# Patient Record
Sex: Female | Born: 1975 | Race: White | Hispanic: No | Smoking: Never smoker
Health system: Southern US, Community
[De-identification: ages and names within clinical notes are randomized; demographics above are authoritative.]

## PROBLEM LIST (undated history)

## (undated) DIAGNOSIS — G43909 Migraine, unspecified, not intractable, without status migrainosus: Secondary | ICD-10-CM

## (undated) DIAGNOSIS — F419 Anxiety disorder, unspecified: Secondary | ICD-10-CM

## (undated) DIAGNOSIS — F329 Major depressive disorder, single episode, unspecified: Secondary | ICD-10-CM

## (undated) DIAGNOSIS — I10 Essential (primary) hypertension: Secondary | ICD-10-CM

## (undated) DIAGNOSIS — O1495 Unspecified pre-eclampsia, complicating the puerperium: Secondary | ICD-10-CM

## (undated) DIAGNOSIS — F32A Depression, unspecified: Secondary | ICD-10-CM

## (undated) DIAGNOSIS — J45909 Unspecified asthma, uncomplicated: Secondary | ICD-10-CM

## (undated) HISTORY — DX: Unspecified pre-eclampsia, complicating the puerperium: O14.95

## (undated) HISTORY — DX: Migraine, unspecified, not intractable, without status migrainosus: G43.909

## (undated) HISTORY — DX: Essential (primary) hypertension: I10

## (undated) HISTORY — PX: KIDNEY STONE SURGERY: SHX686

---
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
## 1998-06-19 ENCOUNTER — Other Ambulatory Visit: Admission: RE | Admit: 1998-06-19 | Discharge: 1998-06-19 | Payer: Self-pay | Admitting: Obstetrics & Gynecology

## 1999-04-18 ENCOUNTER — Emergency Department (HOSPITAL_COMMUNITY): Admission: EM | Admit: 1999-04-18 | Discharge: 1999-04-18 | Payer: Self-pay | Admitting: Emergency Medicine

## 1999-04-19 ENCOUNTER — Encounter: Payer: Self-pay | Admitting: Family Medicine

## 1999-04-19 ENCOUNTER — Ambulatory Visit (HOSPITAL_COMMUNITY): Admission: RE | Admit: 1999-04-19 | Discharge: 1999-04-19 | Payer: Self-pay | Admitting: Family Medicine

## 2000-07-09 ENCOUNTER — Encounter: Payer: Self-pay | Admitting: Obstetrics and Gynecology

## 2000-07-09 ENCOUNTER — Ambulatory Visit (HOSPITAL_COMMUNITY): Admission: RE | Admit: 2000-07-09 | Discharge: 2000-07-09 | Payer: Self-pay | Admitting: Obstetrics and Gynecology

## 2000-09-10 ENCOUNTER — Ambulatory Visit (HOSPITAL_COMMUNITY): Admission: RE | Admit: 2000-09-10 | Discharge: 2000-09-10 | Payer: Self-pay | Admitting: Obstetrics and Gynecology

## 2000-09-24 ENCOUNTER — Encounter: Admission: RE | Admit: 2000-09-24 | Discharge: 2000-12-23 | Payer: Self-pay | Admitting: Obstetrics and Gynecology

## 2000-10-22 ENCOUNTER — Inpatient Hospital Stay (HOSPITAL_COMMUNITY): Admission: AD | Admit: 2000-10-22 | Discharge: 2000-10-22 | Payer: Self-pay | Admitting: Obstetrics and Gynecology

## 2000-10-23 ENCOUNTER — Inpatient Hospital Stay (HOSPITAL_COMMUNITY): Admission: AD | Admit: 2000-10-23 | Discharge: 2000-10-23 | Payer: Self-pay | Admitting: Obstetrics and Gynecology

## 2000-10-27 ENCOUNTER — Encounter (HOSPITAL_COMMUNITY): Admission: RE | Admit: 2000-10-27 | Discharge: 2000-12-02 | Payer: Self-pay | Admitting: Obstetrics and Gynecology

## 2000-12-01 ENCOUNTER — Inpatient Hospital Stay (HOSPITAL_COMMUNITY): Admission: AD | Admit: 2000-12-01 | Discharge: 2000-12-04 | Payer: Self-pay | Admitting: Obstetrics and Gynecology

## 2000-12-08 ENCOUNTER — Inpatient Hospital Stay (HOSPITAL_COMMUNITY): Admission: AD | Admit: 2000-12-08 | Discharge: 2000-12-09 | Payer: Self-pay | Admitting: Obstetrics and Gynecology

## 2001-05-05 ENCOUNTER — Other Ambulatory Visit: Admission: RE | Admit: 2001-05-05 | Discharge: 2001-05-05 | Payer: Self-pay | Admitting: Obstetrics and Gynecology

## 2001-08-09 ENCOUNTER — Encounter: Payer: Self-pay | Admitting: Family Medicine

## 2001-08-09 ENCOUNTER — Encounter: Admission: RE | Admit: 2001-08-09 | Discharge: 2001-08-09 | Payer: Self-pay | Admitting: Family Medicine

## 2001-12-23 ENCOUNTER — Encounter: Admission: RE | Admit: 2001-12-23 | Discharge: 2001-12-23 | Payer: Self-pay | Admitting: Family Medicine

## 2001-12-23 ENCOUNTER — Encounter: Payer: Self-pay | Admitting: Family Medicine

## 2002-02-07 ENCOUNTER — Encounter: Admission: RE | Admit: 2002-02-07 | Discharge: 2002-02-07 | Payer: Self-pay | Admitting: Family Medicine

## 2002-02-07 ENCOUNTER — Encounter: Payer: Self-pay | Admitting: Family Medicine

## 2002-04-18 ENCOUNTER — Encounter: Admission: RE | Admit: 2002-04-18 | Discharge: 2002-04-18 | Payer: Self-pay | Admitting: Family Medicine

## 2002-04-18 ENCOUNTER — Encounter: Payer: Self-pay | Admitting: Family Medicine

## 2002-04-26 ENCOUNTER — Encounter: Admission: RE | Admit: 2002-04-26 | Discharge: 2002-04-26 | Payer: Self-pay | Admitting: Family Medicine

## 2002-04-26 ENCOUNTER — Encounter: Payer: Self-pay | Admitting: Family Medicine

## 2002-06-16 ENCOUNTER — Encounter: Payer: Self-pay | Admitting: Family Medicine

## 2002-06-16 ENCOUNTER — Encounter: Admission: RE | Admit: 2002-06-16 | Discharge: 2002-06-16 | Payer: Self-pay | Admitting: Family Medicine

## 2002-07-05 ENCOUNTER — Other Ambulatory Visit: Admission: RE | Admit: 2002-07-05 | Discharge: 2002-07-05 | Payer: Self-pay | Admitting: Obstetrics and Gynecology

## 2003-06-28 ENCOUNTER — Other Ambulatory Visit: Admission: RE | Admit: 2003-06-28 | Discharge: 2003-06-28 | Payer: Self-pay | Admitting: Obstetrics and Gynecology

## 2003-08-01 ENCOUNTER — Encounter: Payer: Self-pay | Admitting: Obstetrics and Gynecology

## 2003-08-01 ENCOUNTER — Ambulatory Visit (HOSPITAL_COMMUNITY): Admission: RE | Admit: 2003-08-01 | Discharge: 2003-08-01 | Payer: Self-pay | Admitting: Obstetrics and Gynecology

## 2003-11-21 ENCOUNTER — Inpatient Hospital Stay (HOSPITAL_COMMUNITY): Admission: AD | Admit: 2003-11-21 | Discharge: 2003-11-21 | Payer: Self-pay | Admitting: Internal Medicine

## 2003-12-07 ENCOUNTER — Inpatient Hospital Stay (HOSPITAL_COMMUNITY): Admission: AD | Admit: 2003-12-07 | Discharge: 2003-12-07 | Payer: Self-pay | Admitting: Obstetrics and Gynecology

## 2003-12-15 ENCOUNTER — Inpatient Hospital Stay (HOSPITAL_COMMUNITY): Admission: AD | Admit: 2003-12-15 | Discharge: 2003-12-15 | Payer: Self-pay | Admitting: Obstetrics and Gynecology

## 2003-12-26 ENCOUNTER — Inpatient Hospital Stay (HOSPITAL_COMMUNITY): Admission: AD | Admit: 2003-12-26 | Discharge: 2003-12-28 | Payer: Self-pay | Admitting: Obstetrics and Gynecology

## 2004-01-04 ENCOUNTER — Inpatient Hospital Stay (HOSPITAL_COMMUNITY): Admission: AD | Admit: 2004-01-04 | Discharge: 2004-01-08 | Payer: Self-pay | Admitting: Obstetrics and Gynecology

## 2004-11-25 ENCOUNTER — Other Ambulatory Visit: Admission: RE | Admit: 2004-11-25 | Discharge: 2004-11-25 | Payer: Self-pay | Admitting: Obstetrics and Gynecology

## 2005-12-29 ENCOUNTER — Other Ambulatory Visit: Admission: RE | Admit: 2005-12-29 | Discharge: 2005-12-29 | Payer: Self-pay | Admitting: Obstetrics and Gynecology

## 2007-11-08 ENCOUNTER — Encounter: Admission: RE | Admit: 2007-11-08 | Discharge: 2007-11-08 | Payer: Self-pay | Admitting: Family Medicine

## 2010-11-13 ENCOUNTER — Inpatient Hospital Stay (HOSPITAL_COMMUNITY)
Admission: AD | Admit: 2010-11-13 | Discharge: 2010-11-18 | Payer: Self-pay | Source: Home / Self Care | Attending: Obstetrics and Gynecology | Admitting: Obstetrics and Gynecology

## 2010-11-15 ENCOUNTER — Ambulatory Visit (HOSPITAL_COMMUNITY)
Admission: RE | Admit: 2010-11-15 | Discharge: 2010-11-15 | Payer: Self-pay | Source: Home / Self Care | Attending: Obstetrics and Gynecology | Admitting: Obstetrics and Gynecology

## 2010-11-16 ENCOUNTER — Ambulatory Visit (HOSPITAL_COMMUNITY)
Admission: RE | Admit: 2010-11-16 | Discharge: 2010-11-16 | Payer: Self-pay | Source: Home / Self Care | Attending: Obstetrics and Gynecology | Admitting: Obstetrics and Gynecology

## 2010-11-17 ENCOUNTER — Ambulatory Visit
Admission: AD | Admit: 2010-11-17 | Discharge: 2010-11-17 | Payer: Self-pay | Source: Home / Self Care | Attending: Urology | Admitting: Urology

## 2010-12-31 ENCOUNTER — Inpatient Hospital Stay (HOSPITAL_COMMUNITY)
Admission: AD | Admit: 2010-12-31 | Discharge: 2011-01-02 | DRG: 372 | Disposition: A | Discharge status: Home / Self Care | Payer: BC Managed Care – PPO | Source: Ambulatory Visit | Attending: Obstetrics and Gynecology | Admitting: Obstetrics and Gynecology

## 2010-12-31 DIAGNOSIS — O99892 Other specified diseases and conditions complicating childbirth: Secondary | ICD-10-CM | POA: Diagnosis present

## 2010-12-31 DIAGNOSIS — N200 Calculus of kidney: Secondary | ICD-10-CM | POA: Diagnosis present

## 2010-12-31 LAB — CBC
HCT: 27.8 % — ABNORMAL LOW (ref 36.0–46.0)
Hemoglobin: 9.1 g/dL — ABNORMAL LOW (ref 12.0–15.0)
Hemoglobin: 9.1 g/dL — ABNORMAL LOW (ref 12.0–15.0)
MCH: 27 pg (ref 26.0–34.0)
MCHC: 32.9 g/dL (ref 30.0–36.0)
MCHC: 32.7 g/dL (ref 30.0–36.0)
MCV: 82.5 fL (ref 78.0–100.0)
Platelets: 187 10*3/uL (ref 150–400)
RBC: 3.36 MIL/uL — ABNORMAL LOW (ref 3.87–5.11)
RDW: 13.8 % (ref 11.5–15.5)

## 2010-12-31 LAB — COMPREHENSIVE METABOLIC PANEL
ALT: 17 U/L (ref 0–35)
AST: 19 U/L (ref 0–37)
Albumin: 2.5 g/dL — ABNORMAL LOW (ref 3.5–5.2)
CO2: 23 mEq/L (ref 19–32)
Calcium: 8.2 mg/dL — ABNORMAL LOW (ref 8.4–10.5)
GFR calc Af Amer: >60 mL/min (ref >60)
GFR calc non Af Amer: >60 mL/min (ref >60)
Sodium: 135 mEq/L (ref 135–145)

## 2010-12-31 LAB — RPR: RPR Ser Ql: NONREACTIVE

## 2011-01-01 LAB — CBC
HCT: 26.5 % — ABNORMAL LOW (ref 36.0–46.0)
Hemoglobin: 8.7 g/dL — ABNORMAL LOW (ref 12.0–15.0)
MCH: 27.3 pg (ref 26.0–34.0)
MCHC: 32.8 g/dL (ref 30.0–36.0)
MCV: 83.1 fL (ref 78.0–100.0)
RDW: 13.8 % (ref 11.5–15.5)

## 2011-01-01 LAB — BASIC METABOLIC PANEL
BUN: 4 mg/dL — ABNORMAL LOW (ref 6–23)
CO2: 24 mEq/L (ref 19–32)
Calcium: 8.2 mg/dL — ABNORMAL LOW (ref 8.4–10.5)
GFR calc non Af Amer: >60 mL/min (ref >60)
Glucose, Bld: 87 mg/dL (ref 70–99)

## 2011-01-05 ENCOUNTER — Inpatient Hospital Stay (HOSPITAL_COMMUNITY)
Admission: AD | Admit: 2011-01-05 | Discharge: 2011-01-05 | Disposition: A | Discharge status: Home / Self Care | Payer: BC Managed Care – PPO | Source: Ambulatory Visit | Attending: Obstetrics and Gynecology | Admitting: Obstetrics and Gynecology

## 2011-01-05 DIAGNOSIS — O135 Gestational [pregnancy-induced] hypertension without significant proteinuria, complicating the puerperium: Secondary | ICD-10-CM | POA: Insufficient documentation

## 2011-01-05 LAB — CBC
HCT: 26.9 % — ABNORMAL LOW (ref 36.0–46.0)
MCH: 27.1 pg (ref 26.0–34.0)
MCV: 81.8 fL (ref 78.0–100.0)
Platelets: 228 10*3/uL (ref 150–400)
RDW: 14.2 % (ref 11.5–15.5)
WBC: 8.9 10*3/uL (ref 4.0–10.5)

## 2011-01-05 LAB — COMPREHENSIVE METABOLIC PANEL
Albumin: 2.5 g/dL — ABNORMAL LOW (ref 3.5–5.2)
Alkaline Phosphatase: 103 U/L (ref 39–117)
BUN: 6 mg/dL (ref 6–23)
Chloride: 104 mEq/L (ref 96–112)
Creatinine, Ser: 0.44 mg/dL (ref 0.4–1.2)
Glucose, Bld: 94 mg/dL (ref 70–99)
Potassium: 3.4 mEq/L — ABNORMAL LOW (ref 3.5–5.1)
Total Bilirubin: 0.4 mg/dL (ref 0.3–1.2)
Total Protein: 5.9 g/dL — ABNORMAL LOW (ref 6.0–8.3)

## 2011-01-05 LAB — URINALYSIS, ROUTINE W REFLEX MICROSCOPIC
Bilirubin Urine: NEGATIVE
Ketones, ur: NEGATIVE mg/dL
Nitrite: NEGATIVE
Urobilinogen, UA: 0.2 mg/dL (ref 0.0–1.0)

## 2011-01-05 LAB — URIC ACID: Uric Acid, Serum: 4.3 mg/dL (ref 2.4–7.0)

## 2011-01-05 LAB — LACTATE DEHYDROGENASE: LDH: 188 U/L (ref 94–250)

## 2011-01-12 NOTE — Discharge Summary (Signed)
  Annette Cowan, Annette Cowan         ACCOUNT NO.:  [id number]  MEDICAL RECORD NO.:  [id number]           PATIENT TYPE:  I  LOCATION:  9112                          FACILITY:  [facility]  PHYSICIAN:  Zenaida Niece, M.D.DATE OF BIRTH:  1976-06-30  DATE OF ADMISSION:  12/31/2010 DATE OF DISCHARGE:  01/02/2011                              DISCHARGE SUMMARY   ADMISSION DIAGNOSES:  Intrauterine pregnancy at 37 weeks and hypokalemia.  DISCHARGE DIAGNOSES:  Intrauterine pregnancy at 37 weeks and hypokalemia.  PROCEDURE:  On January 01, 2011, she had a spontaneous vaginal delivery.  HISTORY AND PHYSICAL:  This is a 35 year old gravida 3, para 2-0-0-2 with an EGA of 37 plus weeks, who presents with a complaint of regular contractions.  On evaluation in Triage, cervix changed from 3-4 cm dilated.  Prenatal care complicated by kidney stones for which she had cystoscopy and a stent placed but than the stent came out spontaneously.PAST OBSTETRICAL HISTORY:  Two vaginal deliveries at term 7 pounds 6 ounces, 8 pounds 10 ounces, and postpartum PIH x2.  PAST MEDICAL HISTORY:  Kidney stones, pyelonephritis, and depression.  PAST SURGICAL HISTORY:  Urethral dilation.  CURRENT MEDICATIONS:  Zoloft 100 mg p.o. daily.  PHYSICAL EXAMINATION:  She is afebrile with stable vital signs, although blood pressure is slightly labile.  Fetal heart tracing category I with contractions every 4-6 minutes. ABDOMEN:  Gravid, nontender with an estimated fetal weight of 8 pounds. Cervix on my exam is 4, 50, -2 vertex presentation and membranes were ruptured revealing clear fluid.  Admission labs revealed normal PIH labs except for potassium of 2.6.  HOSPITAL COURSE:  The patient was admitted and membranes ruptured to augment labor.  She was given IV potassium runs.  She progressed and received an epidural.  She required Pitocin augmentation.  She progressed to complete, pushed well, early on the morning of the  16th had a vaginal delivery of a viable female infant with Apgars of 7 and 9, weight 8 pounds 7 ounces.  Placenta was manually removed about 25 minutes after delivery.  Perineum was intact and estimated blood loss was 500 mL.  Overnight she did well.  Pre-delivery hemoglobin 9.1, postdelivery 8.7.  Repeat potassium on the morning of the 16th was 2.8. She was put on p.o. potassium at that point.  She had no further complications.  Repeat potassium on February 17, was 3.0.  She was having no problems and requested discharge home.  She was felt to be stable enough for discharge home.  DISCHARGE INSTRUCTIONS:  Regular diet, pelvic rest.  FOLLOWUP:  Follow up is in 1 week to check her potassium level.  MEDICATIONS: 1. Over-the-counter ibuprofen as needed. 2. Percocet #20 one to two p.o. q.4-6 h. p.r.n. pain. 3. K-Dur 20 mEq #30 one p.o. t.i.d. until we can replace her     potassium.     Zenaida Niece, M.D.     TDM/MEDQ  D:  01/02/2011  T:  01/03/2011  Job:  [id number]  Electronically Signed by Lavina Hamman M.D. on 01/12/2011 01:32:44 PM

## 2011-01-26 LAB — URINE MICROSCOPIC-ADD ON

## 2011-01-26 LAB — URINALYSIS, ROUTINE W REFLEX MICROSCOPIC
Ketones, ur: >80 mg/dL — AB
Nitrite: NEGATIVE
Protein, ur: 30 mg/dL — AB
Urobilinogen, UA: 0.2 mg/dL (ref 0.0–1.0)

## 2011-01-26 LAB — CBC
MCH: 27.7 pg (ref 26.0–34.0)
MCHC: 33 g/dL (ref 30.0–36.0)
Platelets: 183 10*3/uL (ref 150–400)
RDW: 14.1 % (ref 11.5–15.5)

## 2011-04-03 NOTE — Discharge Summary (Signed)
NAMEMarland Cowan  Annette, Cowan                   ACCOUNT NO.:  [id number]   MEDICAL RECORD NO.:  [id number]                   PATIENT TYPE:  INP   LOCATION:  9374                                 FACILITY:  [facility]   PHYSICIAN:  Malachi Pro. Ambrose Mantle, M.D.              DATE OF BIRTH:  11-26-1975   DATE OF ADMISSION:  01/04/2004  DATE OF DISCHARGE:  01/08/2004                                 DISCHARGE SUMMARY   BRIEF HISTORY:  35 year-old white female para 2-0-0-2 admitted with  persistent headache, hypertension, and proteinuria.  Patient delivered on  December 26, 2003 an 8-pound 10-ounce infant with moderate shoulder dystocia,  blood pressures were perfectly normal, she was discharged December 28, 2003.  On December 30, 2003 she began having a headache which was constant and  throbbing, she used Percocet without relief, the headache persisted and she  came to our office on the day of admission with headache, blood pressure was  140/92, reflexes were 3 to 4+, she was sent to the hospital for evaluation  with urine protein of 30 mg%, PIH blood work was normal, five blood  pressures were 193/96, 177/88, 157/88, 173/90 and 170/89, the  anesthesiologist did not feel that the patient had a spinal headache.   PAST MEDICAL HISTORY:  1. No known allergies.  2. Operations:  Bladder stim/stretch x2.  3. Illnesses:  Depression, postpartum hypertension and headache after first     delivery; she was hospitalized.   FAMILY HISTORY:  Mother with high blood pressure, depression, thyroid  dysfunction and migraines.  Brother with migraines and depression.   MEDICATIONS:  1. Zoloft 50 mg by mouth every day.  2. Percocet.   OBSTETRICAL HISTORY:  In January 2002 a 7-pound 6-ounce female and in  February 2005 an 8-pound 10-ounce infant.  Both delivered vaginally.   PHYSICAL EXAMINATION:  On admission the patient's vital signs showed the  blood pressure 170/89; pulse, respirations and temperature were  normal.  Her  neck was supple without thyromegaly.  Extraocular movements were intact.  Fundi looked benign.  Heart normal size and sounds; no murmurs.  Lungs were  to P&A.  Abdomen soft, nontender, the fundus was two-thirds of the way to  the umbilicus.  Pelvic exam was deferred.  Deep tendon reflexes were 3 to  4+.  There was equal strength in the upper and lower extremities.   HOSPITAL COURSE:  Patient was placed on magnesium sulfate.  On January 05, 2004 her headache was improved.  She did have a diuresis.  Her potassium was  2.8.  She was placed on potassium.  The headache persisted still located in  her occiput with transmission into her upper neck.  The headache was worse  when she would get up from a lying down position.  She began having  diarrhea.  I asked anesthesia to see her related to a possible spinal  headache.  He did not think that was likely  after examining her and talking  to her.  CT scan of the head was normal.  She did show 1026 mg of protein in  the 24-hour urine specimen confirming preeclampsia.  Patient was placed on  Flexeril for her neck pain but this caused a desaturation into the 80s on  her O2 saturation.  Her diarrhea improved with Lomotil.  Her blood pressures  remained high.  She was given Procardia and she will be discharged on  Procardia.  Stool was sent for culture and sensitivity, Clostridium  difficile, and ova and parasites.  The C. difficile was negative.  Ova and  parasites and culture are pending.  Sets of PIH panels were completely  normal.  Hemoglobin varied from 9.9 to 11, platelet count 498 to 443.  All  liver function tests were normal.  The potassium corrected from 2.8 to 3.7.  Her magnesium level was checked twice and was 5.5 and 6.4.  Urinalysis  showed 30 mg% of protein, 24-hour urine protein on 5400 mL of urine showed  1026 mg, creatinine clearance was 136 mL/min.   FINAL DIAGNOSES:  1. Postpartum preeclampsia.  2. Neck pain of  uncertain etiology.  3. Headache possibly related to the preeclampsia.  4. Diarrhea controlled at the present.  5. Persistent hypertension.   DISCHARGE INSTRUCTIONS:  Patient is discharged on Procardia XL 30 mg a day  and she will complete another 24-hour urine prior to discharge.  She is  asked to return to the office in 1 week for followup examination and to call  with any exacerbation of symptoms.                                               Malachi Pro. Ambrose Mantle, M.D.    TFH/MEDQ  D:  01/08/2004  T:  01/08/2004  Job:  [id number]

## 2011-04-03 NOTE — Discharge Summary (Signed)
[facility]  Patient:    Annette Cowan, Annette Cowan                MRN: [id number] Adm. Date:  [id number] Disc. Date: [id number] Attending:  Oliver Pila                           Discharge Summary                                A 35 year old white female five days postpartum admitted with high blood pressure and headache.  The patient was well until December 06, 2000 when she developed a frontal headache.  Home visit by the nurse showed a blood pressure of 178/110.  Prenatal blood pressures were all normal as were postpartum blood pressures.  She denied double vision.  She had had slight blurred vision.  She had no weakness of her extremities.  She had no slurred speech and no lack of coordination.  Blood pressures in the hospital after delivery had been 106/58 and 120/80.  Her blood pressures on admission were 168/87, 176/97, 186/97, 182/95.  Her physical examination was unremarkable for a postpartum patient.  I placed her on IV phenobarbital and bed rest.  I also had anesthesia to see her to see if the headache could be related to a lumbar puncture at the time of epidural.  On Procardia 30 mg extended release and phenobarbital the patients headache improved.  Her blood pressures came down.  Her last four blood pressures have been 135/73, 130/80, 156/80, 142/80.  She is ready for discharge.  At discharge her white count was 8900, hemoglobin 12.8, hematocrit 37.2, platelet count 343,000.  Comprehensive metabolic profile showed normal liver function tests, AST 27, ALT 24, total bilirubin 0.4, LDH 236.  Urinalysis did show 30 mg % of protein.  Uric acid was 4.7.  FINAL DIAGNOSES:              Postpartum hypertension with headache.  CONDITION ON DISCHARGE:       Improved.  INSTRUCTIONS:                 Regular diet.  Rest.  Phenobarbital 60 mg p.o. q.6h. for three days.  Procardia XL 30 mg p.o. q.d.  Return to the office in one week. DD:   12/09/00 TD:  12/09/00 Job: [id number]

## 2011-04-03 NOTE — Discharge Summary (Signed)
[facility]  Patient:    Annette Cowan, Annette Cowan                MRN: [id number] Adm. Date:  [id number] Disc. Date: [id number] Attending:  Malon Kindle                           Discharge Summary  ADMISSION DIAGNOSES:          Intrauterine pregnancy at 39 weeks, class A1 gestational diabetes, history of depression.  DISCHARGE DIAGNOSES:          Intrauterine pregnancy at 39 weeks, class A1 gestational diabetes, history of depression.  PROCEDURE:                    Spontaneous vaginal delivery on December 02, 2000.  COMPLICATIONS:                Postpartum depression.  CONSULTATIONS:                None.  HISTORY AND PHYSICAL:         This is a 35 year old white female gravida 1, para 0 with an EGA of [redacted] weeks with an EDC of January 22 by a first trimester ultrasound who presents for induction due to low back pain, depression, and a favorable cervix.  Pregnancy complicated by A1 diabetes well controlled on diet.  She has a history of depression and discontinued her antidepressant until she had an increasing problem at 36 weeks and at that time she restarted her Wellbutrin at our recommendation and she has been slowly improving.  PRENATAL LABORATORIES:        Blood type O+.  Negative antibody screen.  RPR nonreactive.  Rubella immune.  Hepatitis B surface antigen negative.  HIV negative.  Gonorrhea and chlamydia negative.  Group B strep negative.  PAST SURGICAL HISTORY:        Bladder stem stretched x 2.  PAST MEDICAL HISTORY:         History of UTIs, pyelonephritis, and depression.  SOCIAL HISTORY:               She is married and nonsmoker.  PHYSICAL EXAMINATION:  VITAL SIGNS:                  Afebrile with stable vital signs.  Fetal heart tracing was reactive and on the monitor she had irregular contractions.  ABDOMEN:                      Soft, nontender with an estimated fetal weight of 7-7.5 pounds.  Admission glucose  82.  PELVIC:                       Cervix was 1+, 70, -2 with a vertex presentation.  HOSPITAL COURSE:              The patient was admitted and Dr. Senaida Ores was able to perform artificial rupture of membranes which revealed clear amniotic fluid.  She was then started on low dose Pitocin.  She progressed into active labor and received an epidural.  She had a slightly protractive course, but did eventually reach complete and pushed well.  Early on the morning of January 17 she had an SVD of a viable female infant with Apgars of 8 and 9 that weighed 7 pounds 6 ounces.  Placenta delivered spontaneous and was intact.  She had a three vessel cord.  Cervix and rectum were intact.  She had a second degree laceration repaired with 2-0 Vicryl.  Estimated blood loss was approximately 350 cc.  Postpartum she did very well except for depression and she was restarted on her Wellbutrin 75 mg b.i.d.  Predelivery hemoglobin was 12, postdelivery was 10.8.  On the morning of postpartum day #2 she was feeling better and was felt to be stable enough for discharge home.  She was bottle feeding her infant.  CONDITION ON DISCHARGE:       Stable.  DISPOSITION:                  Discharged to home.  DIET:                         Regular diet.  ACTIVITY:                     Pelvic rest.  FOLLOW-UP:                    Six weeks.  DISCHARGE MEDICATIONS:        1. Over-the-counter ibuprofen or Aleve p.r.n.                                  pain.                               2. Tylenol p.r.n. pain.                               3. Colace b.i.d. to aid in having a bowel                                  movement.                               4. Wellbutrin 75 mg b.i.d. DD:  12/04/00 TD:  12/04/00 Job: [id number]

## 2011-04-03 NOTE — Discharge Summary (Signed)
NAMEMarland Cowan  LONNETTA, KNISKERN                   ACCOUNT NO.:  [id number]   MEDICAL RECORD NO.:  [id number]                   PATIENT TYPE:  INP   LOCATION:  9103                                 FACILITY:  [facility]   PHYSICIAN:  Huel Cote, M.D.              DATE OF BIRTH:  05-14-1976   DATE OF ADMISSION:  12/26/2003  DATE OF DISCHARGE:  12/28/2003                                 DISCHARGE SUMMARY   DISCHARGE DIAGNOSES:  1. Term pregnancy at 38 plus weeks, delivered.  2. A1 diabetes with good control.  3. History of depression.  4. Status post normal spontaneous vaginal delivery with vacuum assistance.   DISCHARGE MEDICATIONS:  1. Motrin 600 mg p.o. every six hours.  2. Percocet one to two tablets p.o. every four hours p.r.n.   FOLLOW UP:  The patient is to follow-up in the office in approximately six  weeks for her routine postpartum exam.   HOSPITAL COURSE:  The patient is a 35 year old Gravida II, Para 1-0-01 who  was admitted at 41 plus weeks gestation for induction of labor given term  status and very variable cervix.  The pregnancy was complicated by A1  diabetes which was excellently controlled and also a history of depression  which was stable her entire pregnancy on Zoloft.  She also had an episode of  hypertension following her last delivery which required readmission,  however, was stable with her blood pressure throughout this prenatal care  and on admission.   OB HISTORY:  Past OB history is significant for a vaginal delivery in  January 2002 of a 7 pound 6 ounce infant.   GYN HISTORY:  None.   PAST MEDICAL HISTORY:  1. Depression.   PAST SURGICAL HISTORY:  1. Bladder stem stretching.   PRENATAL LABS:  O positive.  Antibody negative.  RPR non-reactive.  Rubella  immune.  Hepatitis B surface antigen negative. HIV negative.  GC negative.  Chlamydia negative.  Triple screen negative.  One hour Glucola was 181.  Group B Strep negative.   MEDICATIONS PRIOR TO  ADMISSION:  1. Zoloft 50 mg daily.   ALLERGIES:  None.   HOSPITAL COURSE:  Fetal  heart rate was reactive.  Upon admission blood  sugar was 119.  Her blood pressure was normal as were her other vital signs.  Her cervix was 50, 3 and a -2.  She had rupture of membranes performed with  clear fluid obtained and reached complete dilation throughout the day.  She  pushed for approximately 1 and 1/2 hours with descent of fetal vertex to +3  station.  The patient had significant sacral pain with pushing which became  unbearable and she requested assisted delivery given the outlet station of  fetal vertex. An M-Cup vacuum was applied to the +3 station and the vertex  delivered to crowning with two pulls.  The remainder of the head was pushed  out by maternal effort and a moderate shoulder  dystocia was encountered.  McRoberts, wood screw and suprapubic were attempted, however, did not  release the shoulder.  Therefore, a posterior arm delivery was performed  with anterior shoulder following without difficulty.  The baby was slightly  floppy after delivery given the shoulder dystocia, but responded quickly to  stimulation and blow-by 02.  Apgars were 6 and 8 and weight was 8 pounds 10  ounces.  The placenta was partially manually extracted after it was noted to  be slightly adherent.  The fundus was inspected carefully with no remaining  fragments palpated.  A first degree laceration was repaired with 2-0 Vicryl.  The cervix and rectum were intact.  Estimated blood loss was 400 mL.  The  patient's postpartum hemoglobin was 8.6 on discharge and she was doing very  well with minimal lochia upon discharge.  However, on postpartum day number  one she was noted to pass several large clots.  This stabilized after  bimanual massage and required no further intervention.  Her blood pressure  remained stable throughout her postpartum course and on postpartum day  number two she was felt stable for discharge  home.                                               Huel Cote, M.D.    KR/MEDQ  D:  01/29/2004  T:  01/31/2004  Job:  [id number]

## 2012-12-30 ENCOUNTER — Other Ambulatory Visit: Payer: Self-pay | Admitting: Orthopedic Surgery

## 2013-01-12 ENCOUNTER — Encounter (HOSPITAL_BASED_OUTPATIENT_CLINIC_OR_DEPARTMENT_OTHER): Admission: RE | Payer: Self-pay | Source: Ambulatory Visit

## 2013-01-12 ENCOUNTER — Ambulatory Visit (HOSPITAL_BASED_OUTPATIENT_CLINIC_OR_DEPARTMENT_OTHER)
Admission: RE | Admit: 2013-01-12 | Payer: BC Managed Care – PPO | Source: Ambulatory Visit | Admitting: Orthopedic Surgery

## 2013-01-12 SURGERY — CARPAL TUNNEL RELEASE
Anesthesia: Choice | Site: Wrist | Laterality: Right

## 2015-12-09 ENCOUNTER — Ambulatory Visit (INDEPENDENT_AMBULATORY_CARE_PROVIDER_SITE_OTHER): Payer: BC Managed Care – PPO | Admitting: Licensed Clinical Social Worker

## 2015-12-09 DIAGNOSIS — F331 Major depressive disorder, recurrent, moderate: Secondary | ICD-10-CM

## 2015-12-11 ENCOUNTER — Ambulatory Visit: Payer: BC Managed Care – PPO | Admitting: Licensed Clinical Social Worker

## 2015-12-18 ENCOUNTER — Ambulatory Visit (INDEPENDENT_AMBULATORY_CARE_PROVIDER_SITE_OTHER): Payer: BC Managed Care – PPO | Admitting: Licensed Clinical Social Worker

## 2015-12-18 DIAGNOSIS — F331 Major depressive disorder, recurrent, moderate: Secondary | ICD-10-CM | POA: Diagnosis not present

## 2015-12-25 ENCOUNTER — Ambulatory Visit: Payer: BC Managed Care – PPO | Admitting: Licensed Clinical Social Worker

## 2016-01-08 ENCOUNTER — Ambulatory Visit: Payer: BC Managed Care – PPO | Admitting: Licensed Clinical Social Worker

## 2016-12-16 ENCOUNTER — Ambulatory Visit: Payer: BC Managed Care – PPO | Admitting: Allergy and Immunology

## 2017-01-06 ENCOUNTER — Ambulatory Visit: Payer: BC Managed Care – PPO | Admitting: Allergy and Immunology

## 2017-02-03 ENCOUNTER — Encounter: Payer: Self-pay | Admitting: Allergy and Immunology

## 2017-02-03 ENCOUNTER — Ambulatory Visit (INDEPENDENT_AMBULATORY_CARE_PROVIDER_SITE_OTHER): Payer: BC Managed Care – PPO | Admitting: Allergy and Immunology

## 2017-02-03 ENCOUNTER — Ambulatory Visit: Payer: BC Managed Care – PPO | Admitting: Allergy and Immunology

## 2017-02-03 VITALS — BP 104/72 | HR 72 | Temp 98.5°F | Resp 18 | Ht 59.45 in | Wt 165.2 lb

## 2017-02-03 DIAGNOSIS — J309 Allergic rhinitis, unspecified: Secondary | ICD-10-CM

## 2017-02-03 DIAGNOSIS — H1010 Acute atopic conjunctivitis, unspecified eye: Secondary | ICD-10-CM

## 2017-02-03 DIAGNOSIS — B999 Unspecified infectious disease: Secondary | ICD-10-CM | POA: Diagnosis not present

## 2017-02-03 DIAGNOSIS — J4530 Mild persistent asthma, uncomplicated: Secondary | ICD-10-CM | POA: Diagnosis not present

## 2017-02-03 MED ORDER — FLUTICASONE FUROATE 100 MCG/ACT IN AEPB: 1.0000 | INHALATION_SPRAY | Freq: Every day | RESPIRATORY_TRACT | 5 refills | Status: DC

## 2017-02-03 MED ORDER — OMEPRAZOLE 40 MG PO CPDR: 40.0000 mg | DELAYED_RELEASE_CAPSULE | Freq: Every day | ORAL | 5 refills | Status: DC

## 2017-02-03 NOTE — Progress Notes (Signed)
Dear Annette Cowan,  Thank you for referring Annette Cowan to the [facility] [place] on 02/03/2017.   Below is a summation of this patient's evaluation and recommendations.  Thank you for your referral. I will keep you informed about this patient's response to treatment.   If you have any questions please do not hesitate to contact me.   Sincerely,  Jessica Priest, MD Allergy / Immunology [facility]   ______________________________________________________________________    NEW PATIENT NOTE  Referring Provider: [name], PA-C Primary Provider: [name] Date of office visit: 02/03/2017    Subjective:   Chief Complaint:  Annette Cowan (DOB: Apr 06, 1976) is a 41 y.o. female who presents to the clinic on 02/03/2017 with a chief complaint of Cough and Nasal Congestion .     HPI: Curley presents this clinic in evaluation of problems with her respiratory tract.  Apparently she has problems with allergies dating back to childhood but she's been having recurrent sinus infections manifested as nasal congestion and stuffy nose and pressure on her face and blowing green nasal discharge and occasional otitis media about 8-10 times per year usually requiring treatment with antibiotics and systemic steroids. She's been stuck in this pattern for 5 years even though she continues on a nasal steroid and a leukotriene modifier. There is no obvious provoking factor giving rise to this issue although she does think that when she gets around cats she sometimes developed sneezing and itchy eyes.  She has also had a problem with coughing especially when she gets a infection. She has heard herself wheezing with her coughing. She has been given a short acting bronchodilator and she is not really sure that it helps this issue. She does get somewhat short of breath if she walks any  distance.  In addition, she has a constant cough and a tickle in her throat and intermittent raspy voice and throat clearing. She does have reflux all the way up into her throat that she treats with over-the-counter Tums or similar agent. She does not consume a tremendous amount of caffeine or chocolate and she does not drink alcohol.  Past Medical History:  Diagnosis Date  . Cowan blood pressure   . Migraine   . Preeclampsia in postpartum period     Past Surgical History:  Procedure Laterality Date  . KIDNEY STONE SURGERY      Allergies as of 02/03/2017   No Known Allergies     Medication List      diazepam 5 MG tablet Commonly known as:  VALIUM Take 5 mg by mouth every 12 (twelve) hours as needed for anxiety.   fluticasone 50 MCG/ACT nasal spray Commonly known as:  FLONASE Place 2 sprays into both nostrils daily.   lisinopril 10 MG tablet Commonly known as:  PRINIVIL,ZESTRIL Take 10 mg by mouth daily.   loratadine 10 MG tablet Commonly known as:  CLARITIN Take 10 mg by mouth daily.   montelukast 10 MG tablet Commonly known as:  SINGULAIR Take 10 mg by mouth at bedtime.   OPTIVAR 0.05 % ophthalmic solution Generic drug:  azelastine Place 1 drop into both eyes 2 (two) times daily as needed.   PROAIR HFA 108 (90 Base) MCG/ACT inhaler Generic drug:  albuterol Inhale two puffs every four to six hours as needed for cough or wheeze.   sertraline 100 MG tablet Commonly known as:  ZOLOFT Take 100  mg by mouth daily.   topiramate 100 MG tablet Commonly known as:  TOPAMAX Take 100 mg by mouth at bedtime.   traMADol 50 MG tablet Commonly known as:  ULTRAM       Review of systems negative except as noted in HPI / PMHx or noted below:  Review of Systems  Constitutional: Negative.   HENT: Negative.   Eyes: Negative.   Respiratory: Negative.   Cardiovascular: Negative.   Gastrointestinal: Negative.   Genitourinary: Negative.   Musculoskeletal: Negative.     Skin: Negative.   Neurological: Negative.   Endo/Heme/Allergies: Negative.   Psychiatric/Behavioral: Negative.     Family History  Problem Relation Age of Onset  . Cowan blood pressure Mother   . Colon cancer Maternal Grandmother   . Heart disease Maternal Grandfather   . Diabetes Maternal Grandfather   . Asthma Daughter   . Asthma Son     Social History   Social History  . Marital status: Married    Spouse name: N/A  . Number of children: N/A  . Years of education: N/A   Occupational History  . Not on file.   Social History Main Topics  . Smoking status: Never Smoker  . Smokeless tobacco: Never Used  . Alcohol use No  . Drug use: No  . Sexual activity: Not on file   Other Topics Concern  . Not on file   Social History Narrative  . No narrative on file    Environmental and Social history  Lives in a house with a dry environment, no animals located inside the household, no carpeting in the bedroom, no plastic on the bed or pillow, and no smoking with inside the household. She is a [employer] doctor.  Objective:   Vitals:   02/03/17 1015  BP: 104/72  Pulse: 72  Resp: 18  Temp: 98.5 F (36.9 C)   Cowan: 4' 11.45" (151 cm) Weight: 165 lb 3.2 oz (74.9 kg)  Physical Exam  Constitutional: She is well-developed, well-nourished, and in no distress.  Allergic shiners  HENT:  Head: Normocephalic. Head is without right periorbital erythema and without left periorbital erythema.  Right Ear: Tympanic membrane, external ear and ear canal normal.  Left Ear: Tympanic membrane, external ear and ear canal normal.  Nose: Mucosal edema present. No rhinorrhea.  Mouth/Throat: Uvula is midline, oropharynx is clear and moist and mucous membranes are normal. No oropharyngeal exudate.  Eyes: Conjunctivae and lids are normal. Pupils are equal, round, and reactive to light.  Neck: Trachea normal. No tracheal tenderness present. No tracheal deviation present. No thyromegaly present.   Cardiovascular: Normal rate, regular rhythm, S1 normal, S2 normal and normal heart sounds.   No murmur heard. Pulmonary/Chest: Effort normal and breath sounds normal. No stridor. No tachypnea. No respiratory distress. She has no wheezes. She has no rales. She exhibits no tenderness.  Abdominal: Soft. She exhibits no distension and no mass. There is no hepatosplenomegaly. There is no tenderness. There is no rebound and no guarding.  Musculoskeletal: She exhibits no edema or tenderness.  Lymphadenopathy:       Head (right side): No tonsillar adenopathy present.       Head (left side): No tonsillar adenopathy present.    She has no cervical adenopathy.    She has no axillary adenopathy.  Neurological: She is alert. Gait normal.  Skin: No rash noted. She is not diaphoretic. No erythema. No pallor. Nails show no clubbing.  Psychiatric: Mood and affect normal.  Diagnostics: Allergy skin tests were performed. She demonstrated severe hypersensitivity to grasses, weeds, house dust mite, and cat. She had some slight sensitivity against mold.  Spirometry was performed and demonstrated an FEV1 of 2.35 @ 96 % of predicted. Following the administration of nebulized albuterol her FEV1 rose to 2.51 which was an increase in the FEV1 of 7%.  Assessment and Plan:    1. Allergic rhinoconjunctivitis   2. Asthma, well controlled, mild persistent   3. Recurrent infections     1. Allergen avoidance measures  2. Treat and prevent inflammation:   A. continue Flonase one-2 sprays each nostril one time per day  B. Continue montelukast 10 mg tablet 1 time per day  C. start Arnuity 100 one inhalation 1 time per day. Coupon.  3. Treat and prevent reflux:   A. no caffeine or chocolate  B. Omeprazole 40 mg tablet 1 time per day  4. If needed:   A. nasal saline  B. OTC antihistamine  C. ProAir HFA 2 puffs every 4-6 hours  D. Optivar eyedrop  5. Blood - CBC w/diff, IgA,G,M, IgE, anti-pneumococcal ab,  antitetanus ab  6. Obtain limited sinus CT scan for chronic sinusitis  7. Consider a course of immunotherapy  8. Return to clinic in 3 weeks or earlier if problem  Cala BradfordKimberly certainly has atopic disease and we'll get her to perform allergen avoidance measures as best as possible and consistently use anti-inflammatory medications for her entire respiratory tract as noted above. She would definitely be a candidate for immunotherapy and I have given her literature on this form of treatment during today's visit. In addition, she has a history very consistent with LPR and she will utilize a proton pump inhibitor and eliminate all caffeine and chocolate consumption at this point. Because she has a history of "sinusitis" 8-10 times a year we will obtain a limited sinus CT scan to rule out chronic sinusitis and also look at her B cell immune system to make sure that she can mount an antibody specific response directed against pathogens. I will regroup with her in 3 weeks or earlier if there is a problem to assess her response to this approach.  Jessica PriestEric J. Cassiopeia Florentino, MD [facility] [place]

## 2017-02-03 NOTE — Patient Instructions (Addendum)
  1. Allergen avoidance measures  2. Treat and prevent inflammation:   A. continue Flonase one-2 sprays each nostril one time per day  B. Continue montelukast 10 mg tablet 1 time per day  C. start Arnuity 100 one inhalation 1 time per day. Coupon.  3. Treat and prevent reflux:   A. no caffeine or chocolate  B. Omeprazole 40 mg tablet 1 time per day  4. If needed:   A. nasal saline  B. OTC antihistamine  C. ProAir HFA 2 puffs every 4-6 hours  D. Optivar eyedrop  5. Blood - CBC w/diff, IgA,G,M, IgE, anti-pneumococcal ab, antitetanus ab  6. Obtain limited sinus CT scan for chronic sinusitis  7. Consider a course of immunotherapy  8. Return to clinic in 3 weeks or earlier if problem

## 2017-02-05 ENCOUNTER — Other Ambulatory Visit: Payer: Self-pay | Admitting: Allergy and Immunology

## 2017-02-05 NOTE — Addendum Note (Signed)
Addended by: Mliss FritzBLACK, Kaimana Lurz I on: 02/05/2017 09:33 AM   Modules accepted: Orders

## 2017-02-05 NOTE — Addendum Note (Signed)
Addended by: Mliss FritzBLACK, Margrit Minner I on: 02/05/2017 09:38 AM   Modules accepted: Orders

## 2017-02-08 ENCOUNTER — Ambulatory Visit
Admission: RE | Admit: 2017-02-08 | Discharge: 2017-02-08 | Disposition: A | Discharge status: Home / Self Care | Payer: BC Managed Care – PPO | Source: Ambulatory Visit | Attending: Allergy and Immunology | Admitting: Allergy and Immunology

## 2017-02-08 DIAGNOSIS — J309 Allergic rhinitis, unspecified: Principal | ICD-10-CM

## 2017-02-08 DIAGNOSIS — H1010 Acute atopic conjunctivitis, unspecified eye: Secondary | ICD-10-CM

## 2017-02-08 DIAGNOSIS — B999 Unspecified infectious disease: Secondary | ICD-10-CM

## 2017-02-09 LAB — CBC WITH DIFFERENTIAL/PLATELET
BASOS: 0 %
Basophils Absolute: 0 10*3/uL (ref 0.0–0.2)
EOS (ABSOLUTE): 0.1 10*3/uL (ref 0.0–0.4)
EOS: 1 %
HEMATOCRIT: 40.1 % (ref 34.0–46.6)
HEMOGLOBIN: 13.7 g/dL (ref 11.1–15.9)
IMMATURE GRANS (ABS): 0 10*3/uL (ref 0.0–0.1)
IMMATURE GRANULOCYTES: 0 %
Lymphocytes Absolute: 1.8 10*3/uL (ref 0.7–3.1)
Lymphs: 30 %
MCH: 30.6 pg (ref 26.6–33.0)
MCHC: 34.2 g/dL (ref 31.5–35.7)
MCV: 90 fL (ref 79–97)
MONOS ABS: 0.4 10*3/uL (ref 0.1–0.9)
Monocytes: 7 %
NEUTROS PCT: 62 %
Neutrophils Absolute: 3.6 10*3/uL (ref 1.4–7.0)
Platelets: 239 10*3/uL (ref 150–379)
RBC: 4.48 x10E6/uL (ref 3.77–5.28)
RDW: 13.3 % (ref 12.3–15.4)
WBC: 5.9 10*3/uL (ref 3.4–10.8)

## 2017-02-09 LAB — PNEUMOCOCCAL IM (14 SEROTYPE)
PNEUMO AB TYPE 23 (23F): 1.1 ug/mL — AB (ref >1.3)
PNEUMO AB TYPE 3: 4.4 ug/mL (ref >1.3)
PNEUMO AB TYPE 51 (7F): 1.4 ug/mL (ref >1.3)
PNEUMO AB TYPE 56 (18C): 0.2 ug/mL — AB (ref >1.3)
PNEUMO AB TYPE 68 (9V): 0.9 ug/mL — AB (ref >1.3)
Pneumo Ab Type 1*: 3.3 ug/mL (ref >1.3)
Pneumo Ab Type 12 (12F)*: 1.5 ug/mL (ref >1.3)
Pneumo Ab Type 14*: 8 ug/mL (ref >1.3)
Pneumo Ab Type 19 (19F)*: 12.2 ug/mL (ref >1.3)
Pneumo Ab Type 26 (6B)*: 0.5 ug/mL — ABNORMAL LOW (ref >1.3)
Pneumo Ab Type 4*: 0.5 ug/mL — ABNORMAL LOW (ref >1.3)
Pneumo Ab Type 8*: 0.2 ug/mL — ABNORMAL LOW (ref >1.3)
Pneumo Ab Type 9 (9N)*: 0.4 ug/mL — ABNORMAL LOW (ref >1.3)

## 2017-02-09 LAB — IGG, IGA, IGM
IGA/IMMUNOGLOBULIN A, SERUM: 172 mg/dL (ref 87–352)
IGG (IMMUNOGLOBIN G), SERUM: 848 mg/dL (ref 700–1600)
IGM (IMMUNOGLOBULIN M), SRM: 103 mg/dL (ref 26–217)

## 2017-02-09 LAB — TETANUS ANTIBODY, IGG: Tetanus Ab, IgG: 1.18 IU/mL (ref <0.10)

## 2017-02-09 LAB — IGE: IGE (IMMUNOGLOBULIN E), SERUM: 24 [IU]/mL (ref 0–100)

## 2017-02-24 ENCOUNTER — Ambulatory Visit (INDEPENDENT_AMBULATORY_CARE_PROVIDER_SITE_OTHER): Payer: BC Managed Care – PPO | Admitting: Allergy and Immunology

## 2017-02-24 ENCOUNTER — Encounter: Payer: Self-pay | Admitting: Allergy and Immunology

## 2017-02-24 VITALS — BP 112/68 | HR 72 | Resp 16

## 2017-02-24 DIAGNOSIS — B999 Unspecified infectious disease: Secondary | ICD-10-CM | POA: Diagnosis not present

## 2017-02-24 DIAGNOSIS — J4530 Mild persistent asthma, uncomplicated: Secondary | ICD-10-CM

## 2017-02-24 DIAGNOSIS — J309 Allergic rhinitis, unspecified: Secondary | ICD-10-CM | POA: Diagnosis not present

## 2017-02-24 DIAGNOSIS — H1010 Acute atopic conjunctivitis, unspecified eye: Secondary | ICD-10-CM

## 2017-02-24 NOTE — Progress Notes (Signed)
Follow-up Note  Referring Provider: [name], PA-C Primary Provider: [name] Date of Office Visit: 02/24/2017  Subjective:   Annette Cowan (DOB: 09/22/1976) is a 41 y.o. female who [facility] on 02/24/2017 in re-evaluation of the following:  HPI: Annette Cowan returns to this clinic in reevaluation of her asthma and allergic rhinoconjunctivitis and history of recurrent infections addressed during her initial evaluation of 02/03/2017 at which point in time we have assigned a plan to address these issues.  She is better. She has much less nasal issue at this point in time. She's not as congested and she does not have as much head pressure and she has not been having any coughing and she has no issues with a tickle in her throat. She's been very good about using all of her medical therapy prescribed during her last visit.  Allergies as of 02/24/2017   No Known Allergies     Medication List      diazepam 5 MG tablet Commonly known as:  VALIUM Take 5 mg by mouth every 12 (twelve) hours as needed for anxiety.   fluticasone 50 MCG/ACT nasal spray Commonly known as:  FLONASE Place 2 sprays into both nostrils daily.   Fluticasone Furoate 100 MCG/ACT Aepb Commonly known as:  ARNUITY ELLIPTA Inhale 1 Dose into the lungs daily. Rinse, gargle, and spit after use.   lisinopril 10 MG tablet Commonly known as:  PRINIVIL,ZESTRIL Take 10 mg by mouth daily.   loratadine 10 MG tablet Commonly known as:  CLARITIN Take 10 mg by mouth daily.   montelukast 10 MG tablet Commonly known as:  SINGULAIR Take 10 mg by mouth at bedtime.   omeprazole 40 MG capsule Commonly known as:  PRILOSEC Take 1 capsule (40 mg total) by mouth daily.   OPTIVAR 0.05 % ophthalmic solution Generic drug:  azelastine Place 1 drop into both eyes 2 (two) times daily as needed.   PROAIR HFA 108 (90 Base) MCG/ACT inhaler Generic drug:  albuterol Inhale two puffs  every four to six hours as needed for cough or wheeze.   sertraline 100 MG tablet Commonly known as:  ZOLOFT Take 100 mg by mouth daily.   topiramate 100 MG tablet Commonly known as:  TOPAMAX Take 100 mg by mouth at bedtime.   traMADol 50 MG tablet Commonly known as:  ULTRAM       Past Medical History:  Diagnosis Date  . High blood pressure   . Migraine   . Preeclampsia in postpartum period     Past Surgical History:  Procedure Laterality Date  . KIDNEY STONE SURGERY      Review of systems negative except as noted in HPI / PMHx or noted below:  Review of Systems  Constitutional: Negative.   HENT: Negative.   Eyes: Negative.   Respiratory: Negative.   Cardiovascular: Negative.   Gastrointestinal: Negative.   Genitourinary: Negative.   Musculoskeletal: Negative.   Skin: Negative.   Neurological: Negative.   Endo/Heme/Allergies: Negative.   Psychiatric/Behavioral: Negative.      Objective:   Vitals:   02/24/17 1005  BP: 112/68  Pulse: 72  Resp: 16          Physical Exam  Constitutional: She is well-developed, well-nourished, and in no distress.  HENT:  Head: Normocephalic.  Right Ear: Tympanic membrane, external ear and ear canal normal.  Left Ear: Tympanic membrane, external ear and ear canal normal.  Nose: Nose normal. No mucosal  edema or rhinorrhea.  Mouth/Throat: Uvula is midline, oropharynx is clear and moist and mucous membranes are normal. No oropharyngeal exudate.  Eyes: Conjunctivae are normal.  Neck: Trachea normal. No tracheal tenderness present. No tracheal deviation present. No thyromegaly present.  Cardiovascular: Normal rate, regular rhythm, S1 normal, S2 normal and normal heart sounds.   No murmur heard. Pulmonary/Chest: Breath sounds normal. No stridor. No respiratory distress. She has no wheezes. She has no rales.  Musculoskeletal: She exhibits no edema.  Lymphadenopathy:       Head (right side): No tonsillar adenopathy present.         Head (left side): No tonsillar adenopathy present.    She has no cervical adenopathy.  Neurological: She is alert. Gait normal.  Skin: No rash noted. She is not diaphoretic. No erythema. Nails show no clubbing.  Psychiatric: Mood and affect normal.    Diagnostics: Results of blood tests obtained 02/03/2017 identified IgG 848, IgM 103, IgA 172 MG/DL. White blood cell count was 5.9 with a absolute eosinophil count of 100, hemoglobin 13.7, platelet 239. There were multiple serotypes of pneumococcus with very high titers of antibodies.  Results of a sinus CT scan obtained 02/08/2017 identified the following:  Mild ethmoid sinus mucosal thickening at several sites. No consolidation or opacification of ethmoid air cells. Paranasal sinuses elsewhere clear. There are no air-fluid levels. No bony destruction or expansion. Ostiomeatal unit complexes appear patent bilaterally.  There is inferior nasal septal deviation toward the right. No nares obstruction.  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 Visualized brain parenchyma otherwise appears normal. Visualized mastoid air cells are clear. Study otherwise unremarkable.   Spirometry was performed and demonstrated an FEV1 of 2.30 at 87 % of predicted.  Assessment and Plan:   1. Asthma, well controlled, mild persistent   2. Allergic rhinoconjunctivitis   3. Recurrent infections     1. Continue to perform Allergen avoidance measures  2. Continue to Treat and prevent inflammation:   A. Flonase one-2 sprays each nostril one time per day  B. montelukast 10 mg tablet 1 time per day  C. Arnuity 100 one inhalation 1 time per day. Coupon.  3. Continue to Treat and prevent reflux:   A. no caffeine or chocolate  B. Omeprazole 40 mg tablet 1 time per day  4. If needed:   A. nasal saline  B. OTC antihistamine  C. ProAir HFA 2 puffs every 4-6 hours  D. Optivar eyedrop  5. Return to clinic in 10 weeks or earlier if problem  Annette Cowan is  better and she will continue to use the plan described above for a full 12 weeks and thus I will see her back in this clinic in 10 weeks or earlier if there is a problem. She still needs to move through through the grass season to determine whether or not this plan will be efficacious in preventing her from developing significant problems. If she fails medical therapy she would be a candidate for immunotherapy. I will see her back in this clinic in 10 weeks.  Laurette Schimke, MD Allergy / Immunology [facility]

## 2017-02-24 NOTE — Patient Instructions (Addendum)
  1. Continue to perform Allergen avoidance measures  2. Continue to Treat and prevent inflammation:   A. Flonase one-2 sprays each nostril one time per day  B. montelukast 10 mg tablet 1 time per day  C. Arnuity 100 one inhalation 1 time per day. Coupon.  3. Continue to Treat and prevent reflux:   A. no caffeine or chocolate  B. Omeprazole 40 mg tablet 1 time per day  4. If needed:   A. nasal saline  B. OTC antihistamine  C. ProAir HFA 2 puffs every 4-6 hours  D. Optivar eyedrop  5. Return to clinic in 10 weeks or earlier if problem

## 2017-03-22 ENCOUNTER — Ambulatory Visit
Admission: RE | Admit: 2017-03-22 | Discharge: 2017-03-22 | Disposition: A | Discharge status: Home / Self Care | Payer: BC Managed Care – PPO | Source: Ambulatory Visit | Attending: Physician Assistant | Admitting: Physician Assistant

## 2017-03-22 ENCOUNTER — Other Ambulatory Visit: Payer: Self-pay | Admitting: Physician Assistant

## 2017-03-22 DIAGNOSIS — W19XXXA Unspecified fall, initial encounter: Secondary | ICD-10-CM

## 2017-03-22 DIAGNOSIS — R0781 Pleurodynia: Secondary | ICD-10-CM

## 2017-03-22 DIAGNOSIS — R0689 Other abnormalities of breathing: Secondary | ICD-10-CM

## 2017-03-22 DIAGNOSIS — R0789 Other chest pain: Secondary | ICD-10-CM

## 2017-05-05 ENCOUNTER — Ambulatory Visit (INDEPENDENT_AMBULATORY_CARE_PROVIDER_SITE_OTHER): Payer: BC Managed Care – PPO | Admitting: Allergy and Immunology

## 2017-05-05 ENCOUNTER — Encounter: Payer: Self-pay | Admitting: Allergy and Immunology

## 2017-05-05 VITALS — BP 100/70 | HR 68 | Resp 18

## 2017-05-05 DIAGNOSIS — H1045 Other chronic allergic conjunctivitis: Secondary | ICD-10-CM | POA: Diagnosis not present

## 2017-05-05 DIAGNOSIS — J4530 Mild persistent asthma, uncomplicated: Secondary | ICD-10-CM | POA: Diagnosis not present

## 2017-05-05 DIAGNOSIS — J3089 Other allergic rhinitis: Secondary | ICD-10-CM

## 2017-05-05 DIAGNOSIS — H1010 Acute atopic conjunctivitis, unspecified eye: Secondary | ICD-10-CM

## 2017-05-05 DIAGNOSIS — K219 Gastro-esophageal reflux disease without esophagitis: Secondary | ICD-10-CM | POA: Diagnosis not present

## 2017-05-05 DIAGNOSIS — G43909 Migraine, unspecified, not intractable, without status migrainosus: Secondary | ICD-10-CM

## 2017-05-05 NOTE — Progress Notes (Signed)
Follow-up Note   Referring Provider: [name], PA-C Primary Provider: [name], PA-C Date of Office Visit: 05/05/2017  Subjective:   Annette Cowan (DOB: 1976/03/12) is a 41 y.o. female who [facility] the [facility] on 05/05/2017 in re-evaluation of the following:  HPI: Annette Cowan returns to this clinic in reevaluation of her asthma and allergic rhinoconjunctivitis and LPR and her history of headaches. I last saw her in this clinic in April 2018.  She has done excellent with her airway and has not required a systemic steroid or antibiotic to treat any type of respiratory problem. Her upper airways are wide open and she has no problems with her lungs and she can exercise and does not use a short acting bronchodilator. She has had no issues with reflux and no issues with her throat. She has consolidated caffeine and chocolate consumption.  She is stuck in a pattern of a pounding left temporal and posterior headache about 2-3 times per week for which he uses tramadol 2-3 times per week even while remaining on Topamax.  Allergies as of 05/05/2017   No Known Allergies     Medication List      diazepam 5 MG tablet Commonly known as:  VALIUM Take 5 mg by mouth every 12 (twelve) hours as needed for anxiety.   fluconazole 150 MG tablet Commonly known as:  DIFLUCAN TAKE 1 TABLET WEEKLY   fluticasone 50 MCG/ACT nasal spray Commonly known as:  FLONASE Place 2 sprays into both nostrils daily.   Fluticasone Furoate 100 MCG/ACT Aepb Commonly known as:  ARNUITY ELLIPTA Inhale 1 Dose into the lungs daily. Rinse, gargle, and spit after use.   lisinopril 10 MG tablet Commonly known as:  PRINIVIL,ZESTRIL Take 10 mg by mouth daily.   loratadine 10 MG tablet Commonly known as:  CLARITIN Take 10 mg by mouth daily.   MIRENA (52 MG) 20 MCG/24HR IUD Generic drug:  levonorgestrel Mirena 20 mcg/24 hr (5 years) intrauterine device  Take 1 device by intrauterine  route.   montelukast 10 MG tablet Commonly known as:  SINGULAIR Take 10 mg by mouth at bedtime.   MULTIVITAMIN ADULT PO Take by mouth.   omeprazole 40 MG capsule Commonly known as:  PRILOSEC Take 1 capsule (40 mg total) by mouth daily.   OPTIVAR 0.05 % ophthalmic solution Generic drug:  azelastine Place 1 drop into both eyes 2 (two) times daily as needed.   PROAIR HFA 108 (90 Base) MCG/ACT inhaler Generic drug:  albuterol Inhale two puffs every four to six hours as needed for cough or wheeze.   sertraline 100 MG tablet Commonly known as:  ZOLOFT Take 100 mg by mouth daily.   topiramate 100 MG tablet Commonly known as:  TOPAMAX Take 100 mg by mouth at bedtime.   traMADol 50 MG tablet Commonly known as:  ULTRAM       Past Medical History:  Diagnosis Date  . High blood pressure   . Migraine   . Preeclampsia in postpartum period     Past Surgical History:  Procedure Laterality Date  . KIDNEY STONE SURGERY      Review of systems negative except as noted in HPI / PMHx or noted below:  Review of Systems  Constitutional: Negative.   HENT: Negative.   Eyes: Negative.   Respiratory: Negative.   Cardiovascular: Negative.   Gastrointestinal: Negative.   Genitourinary: Negative.   Musculoskeletal: Negative.   Skin: Negative.   Neurological: Negative.  Endo/Heme/Allergies: Negative.   Psychiatric/Behavioral: Negative.      Objective:   Vitals:   05/05/17 1017  BP: 100/70  Pulse: 68  Resp: 18          Physical Exam  Constitutional: She is well-developed, well-nourished, and in no distress.  HENT:  Head: Normocephalic.  Right Ear: Tympanic membrane, external ear and ear canal normal.  Left Ear: Tympanic membrane, external ear and ear canal normal.  Nose: Nose normal. No mucosal edema or rhinorrhea.  Mouth/Throat: Uvula is midline, oropharynx is clear and moist and mucous membranes are normal. No oropharyngeal exudate.  Eyes: Conjunctivae are  normal.  Neck: Trachea normal. No tracheal tenderness present. No tracheal deviation present. No thyromegaly present.  Cardiovascular: Normal rate, regular rhythm, S1 normal, S2 normal and normal heart sounds.   No murmur heard. Pulmonary/Chest: Breath sounds normal. No stridor. No respiratory distress. She has no wheezes. She has no rales.  Musculoskeletal: She exhibits no edema.  Lymphadenopathy:       Head (right side): No tonsillar adenopathy present.       Head (left side): No tonsillar adenopathy present.    She has no cervical adenopathy.  Neurological: She is alert. Gait normal.  Skin: No rash noted. She is not diaphoretic. No erythema. Nails show no clubbing.  Psychiatric: Mood and affect normal.    Diagnostics:     Spirometry was performed and demonstrated an FEV1 of 2.33 at 90 % of predicted.  The patient had an Asthma Control Test with the following results: ACT Total Score: 22.    Assessment and Plan:   1. Asthma, well controlled, mild persistent   2. Other allergic rhinitis   3. Seasonal allergic conjunctivitis   4. LPRD (laryngopharyngeal reflux disease)   5. Migraine syndrome     1. Continue to perform Allergen avoidance measures  2. Continue to Treat and prevent inflammation:   A. Decrease Flonase one-2 sprays each nostril 3-7 times per week  B. montelukast 10 mg tablet 1 time per day  C. Arnuity 100 one inhalation 1 time per day.    3. Continue to Treat and prevent reflux:   A. no caffeine or chocolate  B. Omeprazole 40 mg tablet 1 time per day  4. If needed:   A. nasal saline  B. OTC antihistamine  C. ProAir HFA 2 puffs every 4-6 hours  D. Optivar eyedrop  5. Visit with neurologist for headache management  6. Return to clinic in 12 weeks or earlier if problem  Overall Annette BattenKim has done very well with her respiratory tract issue while utilizing therapy directed against respiratory tract inflammation and reflux and I will now see if we can decrease  some of her medications by having her use Flonase about 3 times a week. I will regroup with her in 12 weeks and see if there is an opportunity to further consolidate her medical treatment with that visit. I think she needs to see a neurologist for she is using tramadol 2-3 times per week in the management of her headaches even while using Topamax. We will see if we can get that arranged sometime in the next month.  Laurette SchimkeEric Hitoshi Werts, MD Allergy / Immunology [facility]

## 2017-05-05 NOTE — Patient Instructions (Addendum)
  1. Continue to perform Allergen avoidance measures  2. Continue to Treat and prevent inflammation:   A. Decrease Flonase one-2 sprays each nostril 3-7 times per week  B. montelukast 10 mg tablet 1 time per day  C. Arnuity 100 one inhalation 1 time per day.    3. Continue to Treat and prevent reflux:   A. no caffeine or chocolate  B. Omeprazole 40 mg tablet 1 time per day  4. If needed:   A. nasal saline  B. OTC antihistamine  C. ProAir HFA 2 puffs every 4-6 hours  D. Optivar eyedrop  5. Visit with neurologist for headache management  6. Return to clinic in 12 weeks or earlier if problem

## 2017-05-06 ENCOUNTER — Telehealth: Payer: Self-pay

## 2017-05-06 NOTE — Telephone Encounter (Signed)
-----   Message from Titus MouldHannah Kozlow, CMA sent at 05/05/2017 11:37 AM EDT ----- Fuller CanadaHi Dee,  Hope you are doing well.  Can you [facility] Cala BradfordKimberly [facility]?  Thank you, Dahlia ClientHannah

## 2017-05-06 NOTE — Telephone Encounter (Signed)
[facility]. Patient notified.

## 2017-08-09 ENCOUNTER — Other Ambulatory Visit: Payer: Self-pay | Admitting: Allergy and Immunology

## 2017-09-01 ENCOUNTER — Other Ambulatory Visit: Payer: Self-pay | Admitting: Allergy and Immunology

## 2017-09-01 NOTE — Telephone Encounter (Signed)
Received fax for 90 day supply of Omeprazole. Patient was last seen 05/05/2017. Refill was sent.

## 2017-09-06 ENCOUNTER — Other Ambulatory Visit: Payer: Self-pay | Admitting: Allergy and Immunology

## 2017-09-10 ENCOUNTER — Other Ambulatory Visit: Payer: Self-pay | Admitting: Allergy and Immunology

## 2017-09-10 ENCOUNTER — Telehealth: Payer: Self-pay | Admitting: Allergy and Immunology

## 2017-09-10 NOTE — Telephone Encounter (Signed)
Informed patient that rx was sent 09-06-17. She will call pharmacy again.

## 2017-09-10 NOTE — Telephone Encounter (Signed)
Pt called and said she was trying to get her omeprazole refilled and she has 4 pills left. And would like to have 90 days instead 30 because it would be cheaper. [facility] [address] [phone].

## 2017-09-11 ENCOUNTER — Other Ambulatory Visit: Payer: Self-pay | Admitting: Allergy and Immunology

## 2017-09-24 ENCOUNTER — Other Ambulatory Visit: Payer: Self-pay | Admitting: Physician Assistant

## 2017-09-24 ENCOUNTER — Ambulatory Visit
Admission: RE | Admit: 2017-09-24 | Discharge: 2017-09-24 | Disposition: A | Discharge status: Home / Self Care | Payer: BC Managed Care – PPO | Source: Ambulatory Visit | Attending: Physician Assistant | Admitting: Physician Assistant

## 2017-09-24 DIAGNOSIS — R52 Pain, unspecified: Secondary | ICD-10-CM

## 2017-10-12 ENCOUNTER — Other Ambulatory Visit: Payer: Self-pay | Admitting: Allergy and Immunology

## 2017-11-14 ENCOUNTER — Other Ambulatory Visit: Payer: Self-pay | Admitting: Allergy and Immunology

## 2017-11-17 ENCOUNTER — Other Ambulatory Visit: Payer: Self-pay | Admitting: Allergy and Immunology

## 2017-12-02 ENCOUNTER — Other Ambulatory Visit: Payer: Self-pay | Admitting: Allergy and Immunology

## 2017-12-06 ENCOUNTER — Other Ambulatory Visit: Payer: Self-pay | Admitting: Allergy and Immunology

## 2017-12-06 NOTE — Telephone Encounter (Signed)
Courtesy refill  

## 2017-12-17 ENCOUNTER — Other Ambulatory Visit: Payer: Self-pay | Admitting: Allergy and Immunology

## 2018-01-20 ENCOUNTER — Other Ambulatory Visit: Payer: Self-pay | Admitting: Obstetrics and Gynecology

## 2018-01-20 DIAGNOSIS — N63 Unspecified lump in unspecified breast: Secondary | ICD-10-CM

## 2018-01-20 DIAGNOSIS — N6489 Other specified disorders of breast: Secondary | ICD-10-CM

## 2018-01-24 ENCOUNTER — Ambulatory Visit
Admission: RE | Admit: 2018-01-24 | Discharge: 2018-01-24 | Disposition: A | Discharge status: Home / Self Care | Payer: BC Managed Care – PPO | Source: Ambulatory Visit | Attending: Obstetrics and Gynecology | Admitting: Obstetrics and Gynecology

## 2018-01-24 ENCOUNTER — Ambulatory Visit: Payer: Self-pay

## 2018-01-24 DIAGNOSIS — N63 Unspecified lump in unspecified breast: Secondary | ICD-10-CM

## 2018-01-24 DIAGNOSIS — N6489 Other specified disorders of breast: Secondary | ICD-10-CM

## 2018-08-19 ENCOUNTER — Other Ambulatory Visit: Payer: Self-pay | Admitting: Physician Assistant

## 2018-08-19 DIAGNOSIS — R102 Pelvic and perineal pain: Secondary | ICD-10-CM

## 2018-08-23 ENCOUNTER — Other Ambulatory Visit: Payer: Self-pay

## 2018-08-31 ENCOUNTER — Ambulatory Visit: Payer: BC Managed Care – PPO | Admitting: Podiatry

## 2018-08-31 ENCOUNTER — Encounter: Payer: Self-pay | Admitting: Podiatry

## 2018-08-31 ENCOUNTER — Ambulatory Visit (INDEPENDENT_AMBULATORY_CARE_PROVIDER_SITE_OTHER): Payer: BC Managed Care – PPO

## 2018-08-31 VITALS — BP 120/66 | HR 72

## 2018-08-31 DIAGNOSIS — M76822 Posterior tibial tendinitis, left leg: Secondary | ICD-10-CM | POA: Diagnosis not present

## 2018-08-31 DIAGNOSIS — M659 Synovitis and tenosynovitis, unspecified: Secondary | ICD-10-CM | POA: Diagnosis not present

## 2018-08-31 DIAGNOSIS — M76821 Posterior tibial tendinitis, right leg: Secondary | ICD-10-CM | POA: Diagnosis not present

## 2018-08-31 DIAGNOSIS — M7752 Other enthesopathy of left foot: Secondary | ICD-10-CM

## 2018-08-31 DIAGNOSIS — M7751 Other enthesopathy of right foot: Secondary | ICD-10-CM

## 2018-08-31 DIAGNOSIS — M7750 Other enthesopathy of unspecified foot: Secondary | ICD-10-CM

## 2018-08-31 MED ORDER — METHYLPREDNISOLONE 4 MG PO TBPK: ORAL_TABLET | ORAL | 0 refills | Status: DC

## 2018-08-31 MED ORDER — MELOXICAM 15 MG PO TABS: 15.0000 mg | ORAL_TABLET | Freq: Every day | ORAL | 1 refills | Status: DC

## 2018-09-08 NOTE — Progress Notes (Signed)
    HPI: 42 year old female presenting today as a new patient with a chief complaint of medial bilateral ankle pain that has been ongoing for the past several months. Walking and bearing weight increases the pain. She has been applying ice and heat therapy with no significant relief. Patient is here for further evaluation and treatment.   Past Medical History:  Diagnosis Date  . High blood pressure   . Migraine   . Preeclampsia in postpartum period        Physical Exam: General: The patient is alert and oriented x3 in no acute distress.  Dermatology: Skin is warm, dry and supple bilateral lower extremities. Negative for open lesions or macerations.  Vascular: Palpable pedal pulses bilaterally. No edema or erythema noted. Capillary refill within normal limits.  Neurological: Epicritic and protective threshold grossly intact bilaterally.   Musculoskeletal Exam: Pain on palpation noted to the posterior tibial tendon of the bilateral feet as well and the medial aspects of the bilateral ankles. Range of motion within normal limits. Muscle strength 5/5 in all muscle groups bilateral lower extremities.  Radiographic Exam:  Normal osseous mineralization. Joint spaces preserved. No fracture or dislocation identified.    Assessment: 1. Posterior tibial tendinitis bilateral  2. Ankle synovitis bilateral    Plan of Care:  1. Patient was evaluated. Radiographs were reviewed today. 2. Injection of 0.5 mL Celestone Soluspan injected into the bilateral ankle joints.  3. Prescription for Medrol Dose Pak provided to patient.  4. Prescription for Meloxicam provided to patient.  5. Return to clinic in 4 weeks.   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    Felecia Shelling, DPM [facility]  Dr. Felecia Shelling, DPM    [address]                                        [place], [place] [address]                Office [phone]  Fax [phone]

## 2018-09-28 ENCOUNTER — Ambulatory Visit: Payer: BC Managed Care – PPO | Admitting: Podiatry

## 2018-12-23 ENCOUNTER — Other Ambulatory Visit: Payer: Self-pay | Admitting: Podiatry

## 2019-01-24 ENCOUNTER — Other Ambulatory Visit: Payer: Self-pay | Admitting: Otolaryngology

## 2019-01-24 DIAGNOSIS — J329 Chronic sinusitis, unspecified: Secondary | ICD-10-CM

## 2019-01-28 ENCOUNTER — Encounter (HOSPITAL_COMMUNITY): Payer: Self-pay | Admitting: Family Medicine

## 2019-01-28 ENCOUNTER — Other Ambulatory Visit: Payer: Self-pay

## 2019-01-28 ENCOUNTER — Ambulatory Visit (HOSPITAL_COMMUNITY)
Admission: EM | Admit: 2019-01-28 | Discharge: 2019-01-28 | Disposition: A | Discharge status: Home / Self Care | Payer: Worker's Compensation | Attending: Family Medicine | Admitting: Family Medicine

## 2019-01-28 DIAGNOSIS — S43422A Sprain of left rotator cuff capsule, initial encounter: Secondary | ICD-10-CM

## 2019-01-28 DIAGNOSIS — S5002XA Contusion of left elbow, initial encounter: Secondary | ICD-10-CM | POA: Diagnosis not present

## 2019-01-28 DIAGNOSIS — Y92811 Bus as the place of occurrence of the external cause: Secondary | ICD-10-CM

## 2019-01-28 DIAGNOSIS — S76012A Strain of muscle, fascia and tendon of left hip, initial encounter: Secondary | ICD-10-CM | POA: Diagnosis not present

## 2019-01-28 IMAGING — CT CT MAXILLOFACIAL W/O CM
2 of 3 series · 15 of 37 positions shown, 18 images · non-contrast
Comparison: November 08, 2007

CLINICAL DATA: Multiple episodes of sinusitis.  Rhinitis.  Fever.

EXAM:
CT MAXILLOFACIAL WITHOUT CONTRAST
TECHNIQUE: Multidetector CT imaging of the maxillofacial structures was
performed. Multiplanar CT image reconstructions were also generated.
A small metallic BB was placed on the right temple in order to
reliably differentiate right from left.

[Series 601: coronal facial · coronal · 0.33mm/px · 3 of 83 slices shown]
[im 28/83  bone]
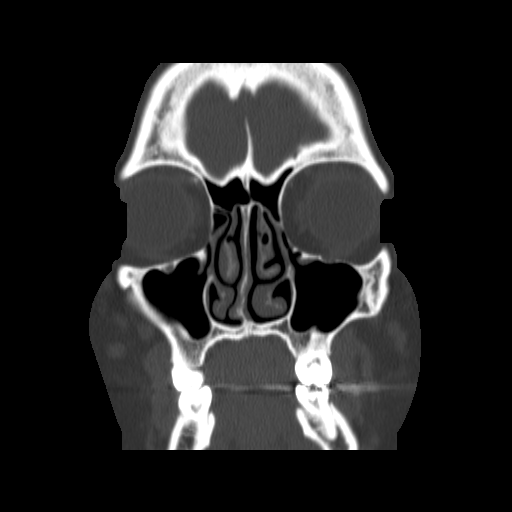
[im 42/83  bone]
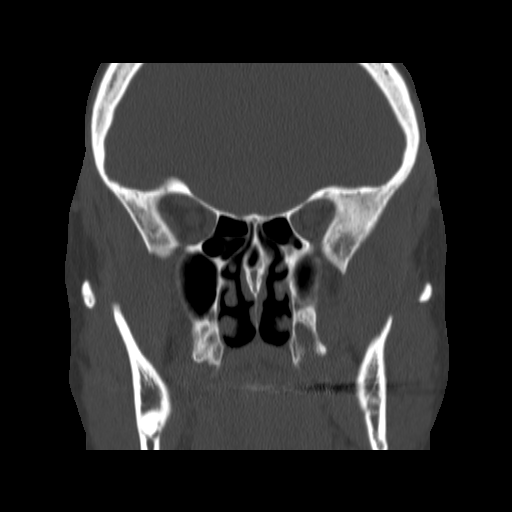
[im 55/83  bone]
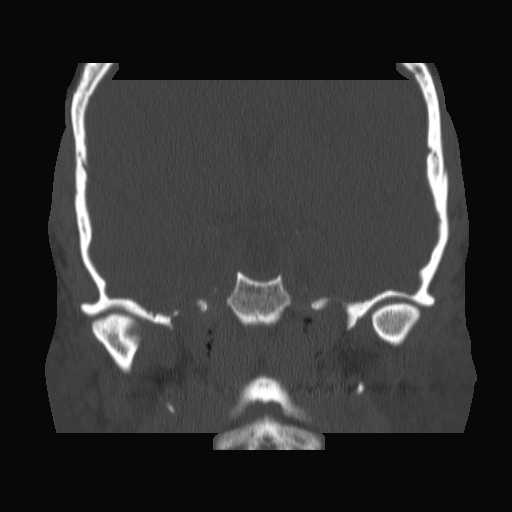

[Series 602: sagittal facial · sagittal · 0.33mm/px · 12 of 82 slices shown, 15 images]
[im 5/82  brain]
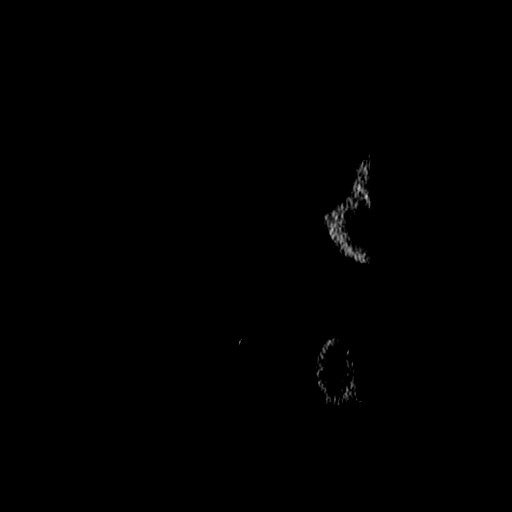
[im 5/82  bone]
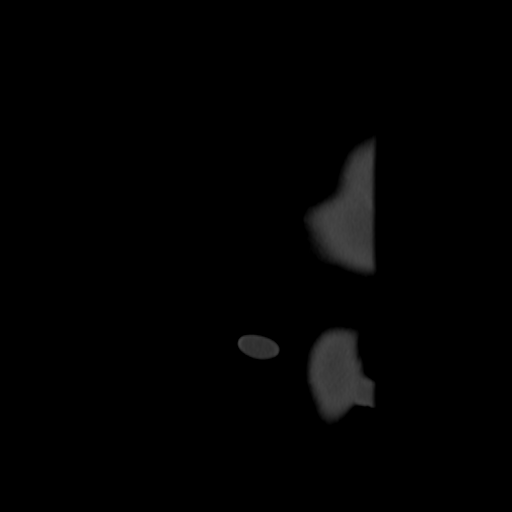
[im 15/82  bone]
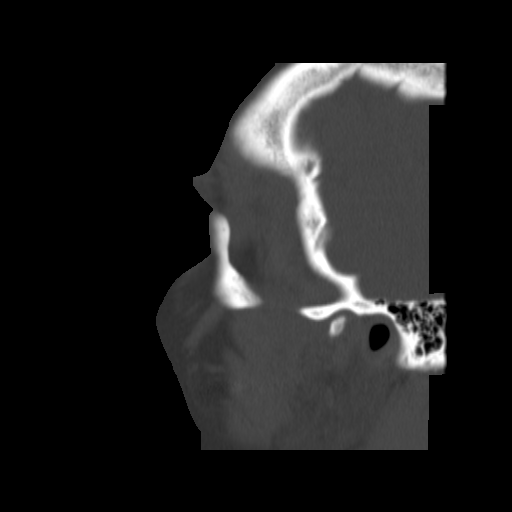
[im 20/82  bone]
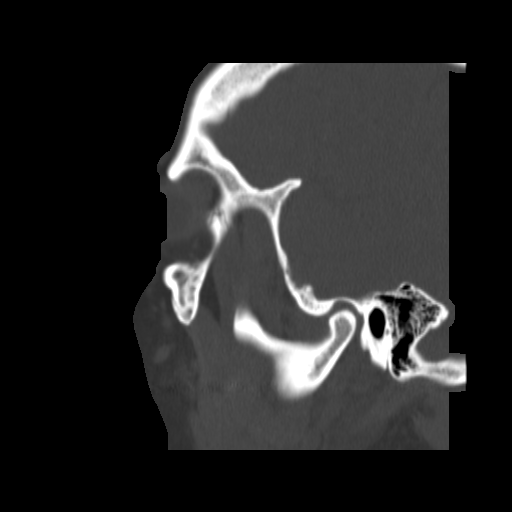
[im 24/82  bone]
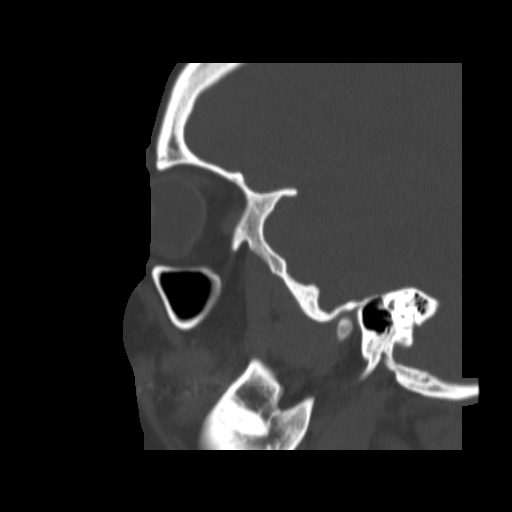
[im 34/82  brain]
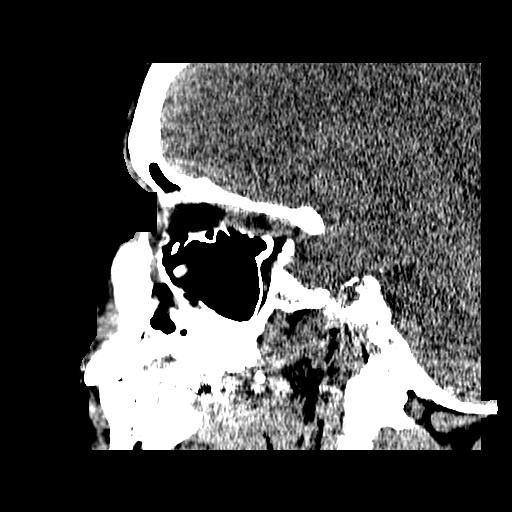
[im 34/82  bone]
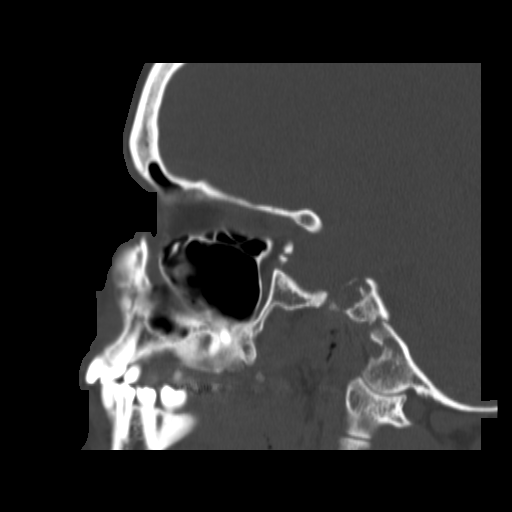
[im 39/82  bone]
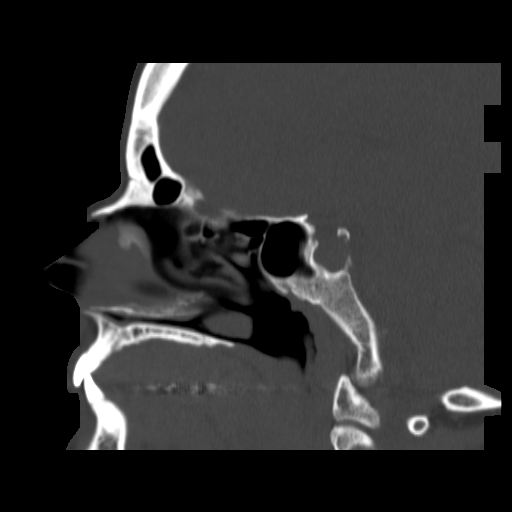
[im 43/82  bone]
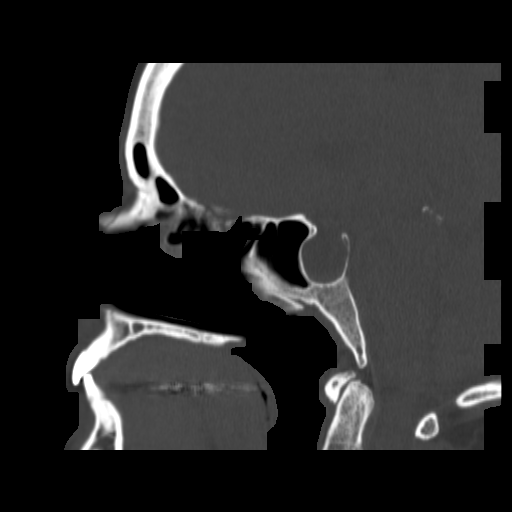
[im 53/82  bone]
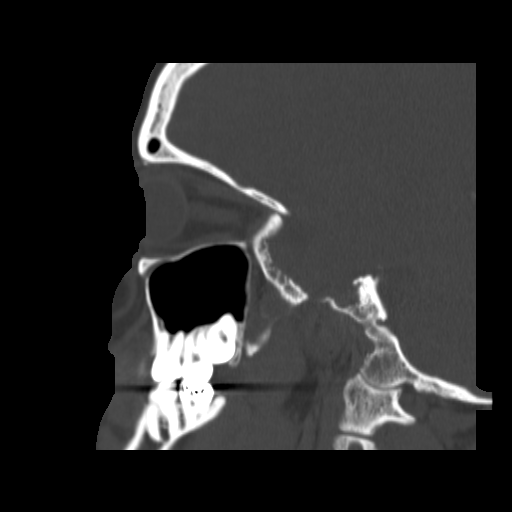
[im 58/82  brain]
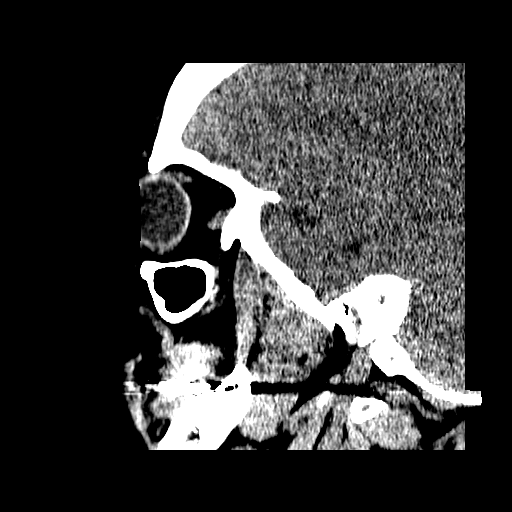
[im 58/82  bone]
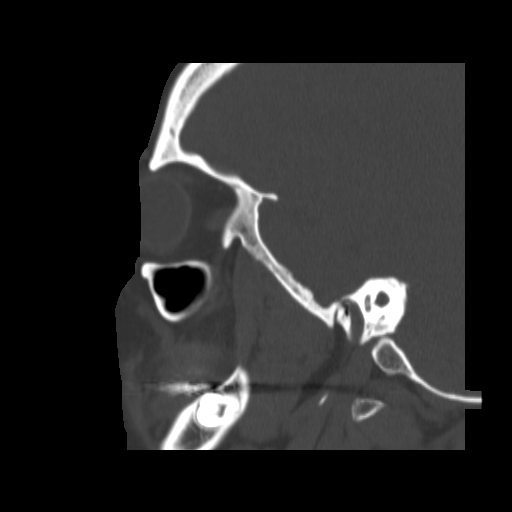
[im 62/82  bone]
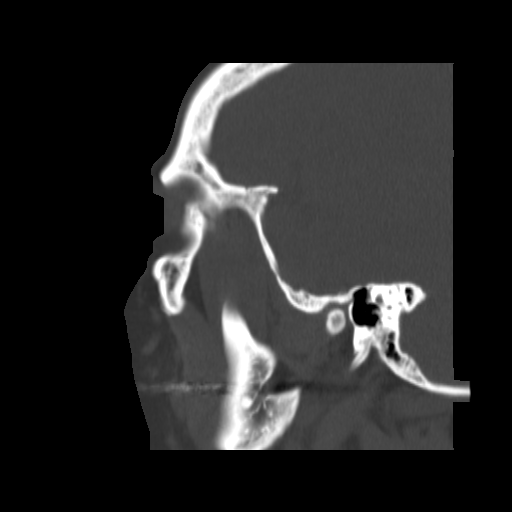
[im 72/82  bone]
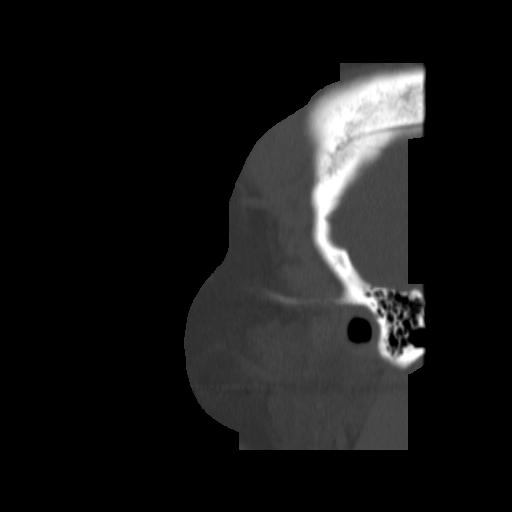
[im 77/82  bone]
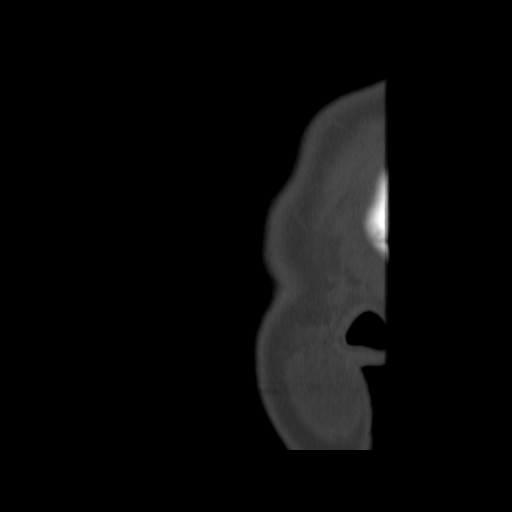

[15 of 37 positions shown; findings below may reference images not displayed]

FINDINGS: Osseous: No fracture or dislocation. No blastic or lytic bone
lesions evident.

Orbits: Orbits appear symmetric bilaterally.

Sinuses: There is mucosal thickening in several ethmoid air cells
bilaterally. There is no consolidation ethmoid air cell regions.
Elsewhere, other paranasal sinuses are clear. There are no air-fluid
levels. No bony destruction or expansion. Ostiomeatal unit complexes
are patent bilaterally. There is rightward deviation of the lower
nasal septum. There is no nares obstruction on either side.

Soft tissues: No soft tissue edema or abscess. No soft tissue mass.
Visualized salivary glands appear normal. No adenopathy.

Limited intracranial: Mastoid air cells which are visualized are
clear. Visualized intracranial contents appear unremarkable except
for a degree of invagination of CSF into the sella.
IMPRESSION: Mild ethmoid sinus mucosal thickening at several sites. No
consolidation or opacification of ethmoid air cells. Paranasal
sinuses elsewhere clear. There are no air-fluid levels. No bony
destruction or expansion. Ostiomeatal unit complexes appear patent
bilaterally.

There is inferior nasal septal deviation toward the right. No nares
obstruction.

There is a degree of empty sella. Visualized brain parenchyma
otherwise appears normal. Visualized mastoid air cells are clear.
Study otherwise unremarkable.
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
## 2019-01-28 NOTE — Discharge Instructions (Addendum)
Gentle range of motion of the shoulder and hip as demonstrated for the next 5 days should result in resolution  Return if symptoms continue  Use the meloxicam for the inflammation
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
## 2019-02-03 ENCOUNTER — Other Ambulatory Visit: Payer: Self-pay

## 2019-03-11 IMAGING — CR DG RIBS W/ CHEST 3+V*L*
3 series · 3 of 3 positions shown · non-contrast
Comparison: 01/07/2004

CLINICAL DATA: Left anterior rib pain, shortness of breath, recent
injury 5 days ago.

EXAM:
LEFT RIBS AND CHEST - 3+ VIEW

[w chest pa]
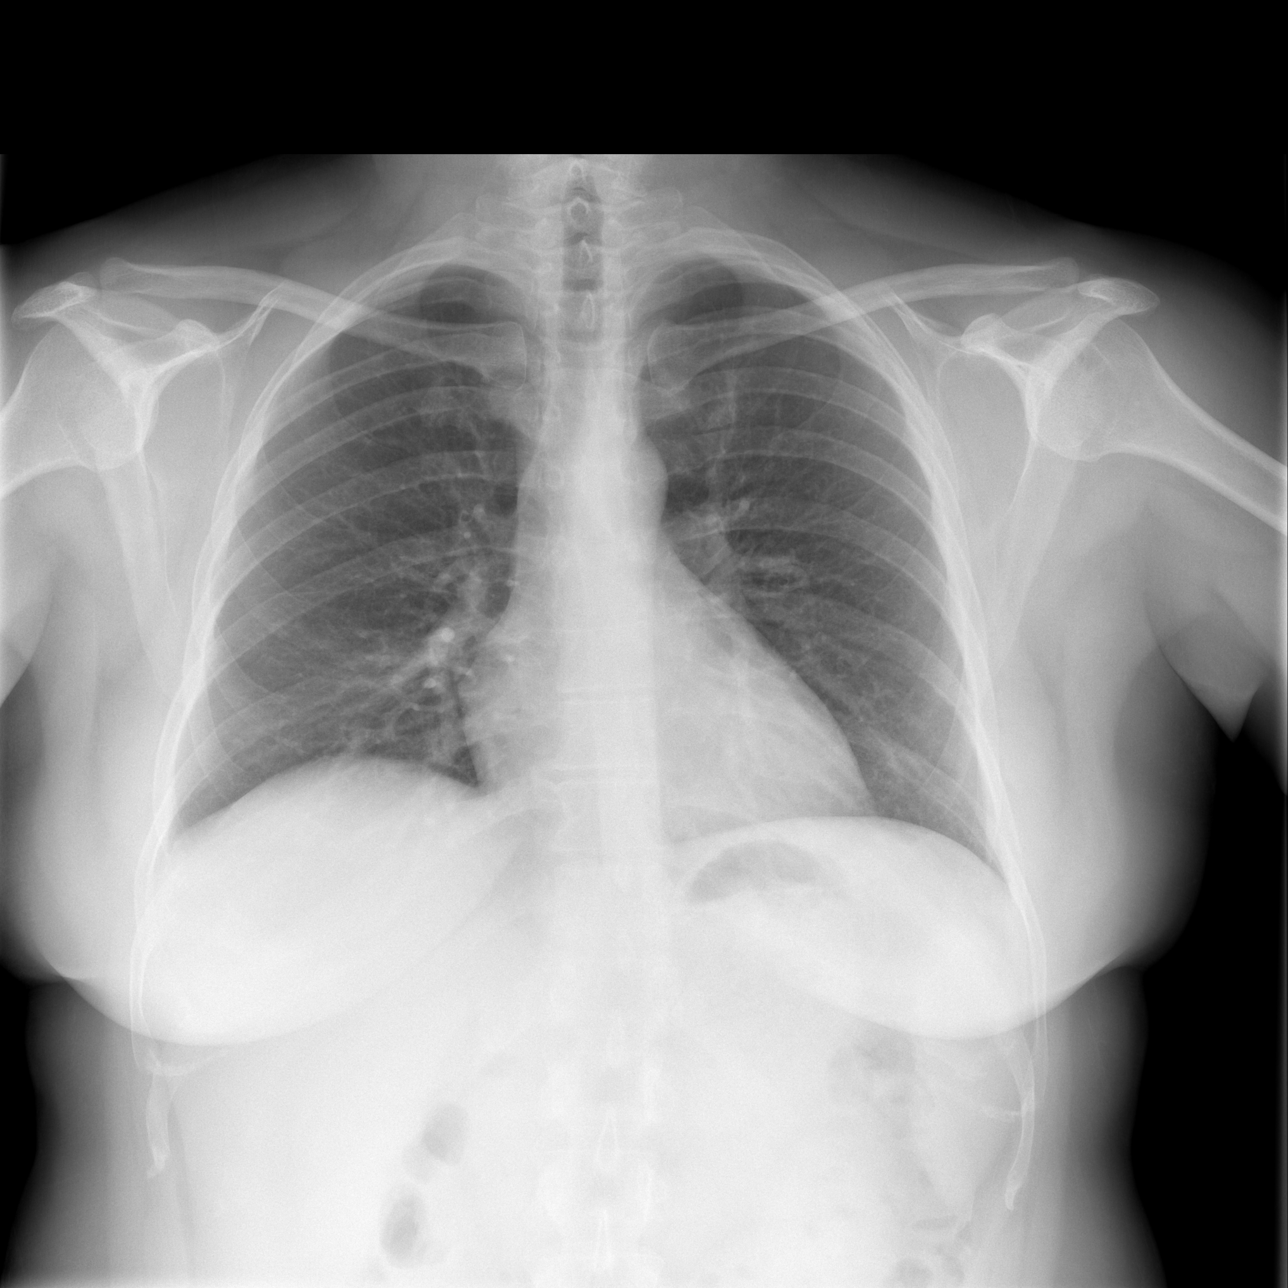

[w ribs ap/pa lower right]
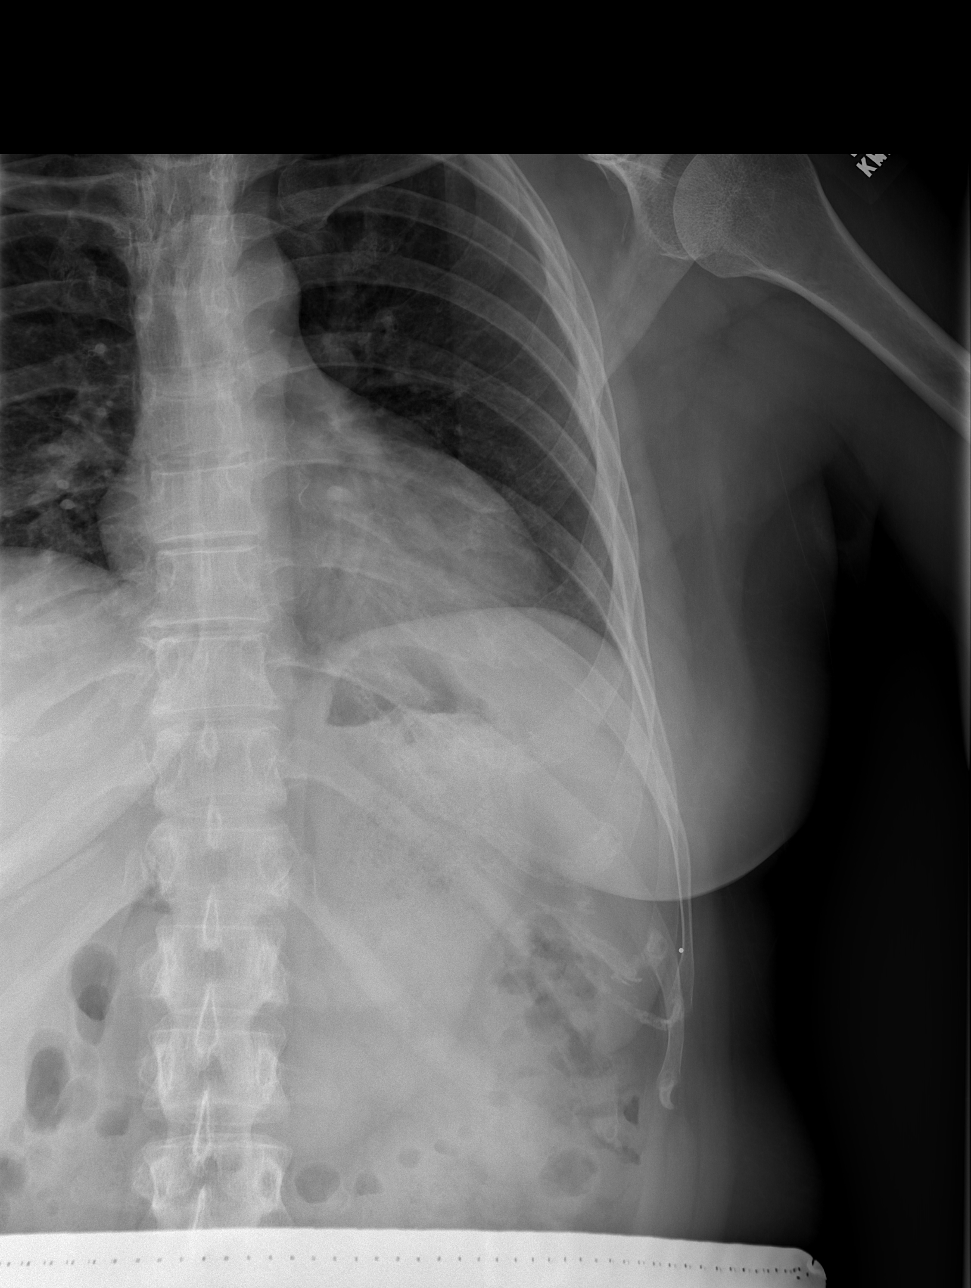

[w ribs oblique right]
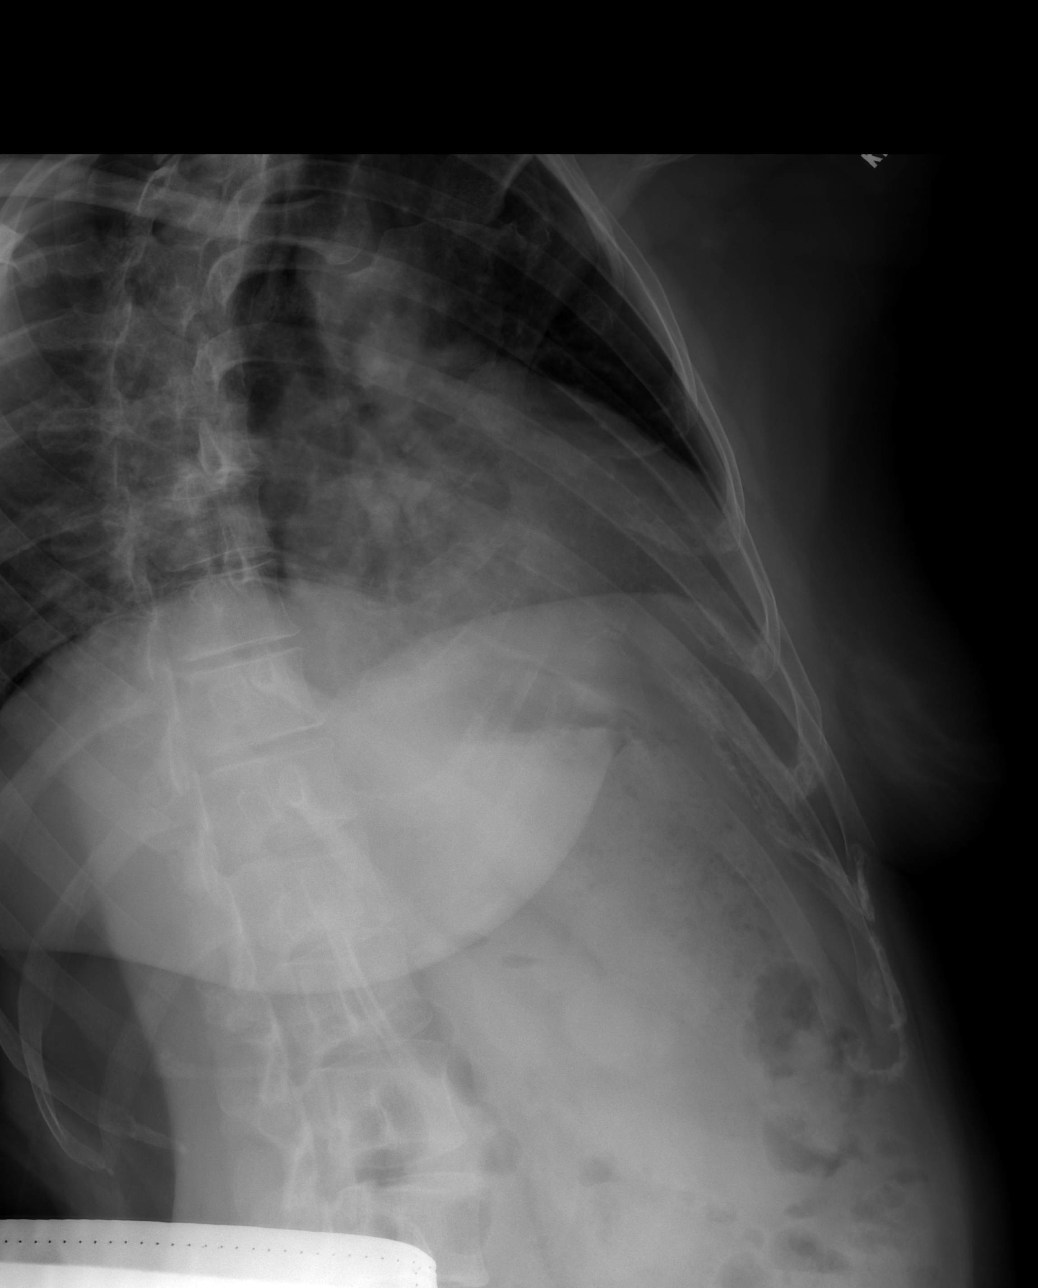

[3 of 3 positions shown; findings below may reference images not displayed]

FINDINGS: No fracture or other bone lesions are seen involving the ribs. There
is no evidence of pneumothorax or pleural effusion. Both lungs are
clear. Heart size and mediastinal contours are within normal limits.
IMPRESSION: Negative.

## 2019-05-08 ENCOUNTER — Ambulatory Visit: Payer: BC Managed Care – PPO | Admitting: Podiatry

## 2019-05-08 ENCOUNTER — Other Ambulatory Visit: Payer: Self-pay

## 2019-05-08 ENCOUNTER — Ambulatory Visit (INDEPENDENT_AMBULATORY_CARE_PROVIDER_SITE_OTHER): Payer: BC Managed Care – PPO

## 2019-05-08 ENCOUNTER — Encounter: Payer: Self-pay | Admitting: Podiatry

## 2019-05-08 VITALS — Temp 98.2°F

## 2019-05-08 DIAGNOSIS — M722 Plantar fascial fibromatosis: Secondary | ICD-10-CM

## 2019-05-08 MED ORDER — MELOXICAM 15 MG PO TABS: 15.0000 mg | ORAL_TABLET | Freq: Every day | ORAL | 1 refills | Status: DC

## 2019-05-08 MED ORDER — METHYLPREDNISOLONE 4 MG PO TBPK: ORAL_TABLET | ORAL | 0 refills | Status: DC

## 2019-05-10 NOTE — Progress Notes (Signed)
   Subjective: 43 y.o. female presenting today with a chief complaint of pain to the plantar aspect of the bilateral heels that began about three months ago. She states the pain radiates to the posterior heels. She describes the pain as burning and states standing makes it worse. She has been taking OTC pain relief for treatment. Patient is here for further evaluation and treatment.   Past Medical History:  Diagnosis Date  . High blood pressure   . Migraine   . Preeclampsia in postpartum period      Objective: Physical Exam General: The patient is alert and oriented x3 in no acute distress.  Dermatology: Skin is warm, dry and supple bilateral lower extremities. Negative for open lesions or macerations bilateral.   Vascular: Dorsalis Pedis and Posterior Tibial pulses palpable bilateral.  Capillary fill time is immediate to all digits.  Neurological: Epicritic and protective threshold intact bilateral.   Musculoskeletal: Tenderness to palpation to the plantar aspect of the bilateral heels along the plantar fascia. All other joints range of motion within normal limits bilateral. Strength 5/5 in all groups bilateral.   Radiographic exam: Normal osseous mineralization. Joint spaces preserved. No fracture/dislocation/boney destruction. No other soft tissue abnormalities or radiopaque foreign bodies.   Assessment: 1. plantar fasciitis bilateral feet  Plan of Care:  1. Patient evaluated. Xrays reviewed.   2. Injection of 0.5cc Celestone soluspan injected into the bilateral heels.  3. Rx for Medrol Dose Pak placed 4. Rx for Meloxicam ordered for patient. 5. Plantar fascial band(s) dispensed for bilateral plantar fasciitis. 6. Instructed patient regarding therapies and modalities at home to alleviate symptoms.  7. Return to clinic in 4 weeks.    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   Edrick Kins, DPM [facility]  Dr. Edrick Kins, DPM    [address], [place] [address]                Office [phone]  Fax [phone]

## 2019-06-05 ENCOUNTER — Ambulatory Visit: Payer: BC Managed Care – PPO | Admitting: Podiatry

## 2019-07-01 ENCOUNTER — Other Ambulatory Visit: Payer: Self-pay | Admitting: Podiatry

## 2019-07-13 ENCOUNTER — Emergency Department (HOSPITAL_COMMUNITY): Payer: BC Managed Care – PPO

## 2019-07-13 ENCOUNTER — Emergency Department (HOSPITAL_BASED_OUTPATIENT_CLINIC_OR_DEPARTMENT_OTHER): Payer: BC Managed Care – PPO

## 2019-07-13 ENCOUNTER — Encounter: Payer: Self-pay | Admitting: Emergency Medicine

## 2019-07-13 ENCOUNTER — Other Ambulatory Visit: Payer: Self-pay

## 2019-07-13 ENCOUNTER — Emergency Department (HOSPITAL_COMMUNITY)
Admission: EM | Admit: 2019-07-13 | Discharge: 2019-07-13 | Disposition: A | Discharge status: Home / Self Care | Payer: BC Managed Care – PPO | Attending: Emergency Medicine | Admitting: Emergency Medicine

## 2019-07-13 DIAGNOSIS — M7989 Other specified soft tissue disorders: Secondary | ICD-10-CM | POA: Diagnosis not present

## 2019-07-13 DIAGNOSIS — R600 Localized edema: Secondary | ICD-10-CM | POA: Diagnosis not present

## 2019-07-13 DIAGNOSIS — R42 Dizziness and giddiness: Secondary | ICD-10-CM | POA: Insufficient documentation

## 2019-07-13 DIAGNOSIS — R0602 Shortness of breath: Secondary | ICD-10-CM | POA: Diagnosis not present

## 2019-07-13 DIAGNOSIS — R0789 Other chest pain: Secondary | ICD-10-CM

## 2019-07-13 DIAGNOSIS — Z79899 Other long term (current) drug therapy: Secondary | ICD-10-CM | POA: Diagnosis not present

## 2019-07-13 LAB — D-DIMER, QUANTITATIVE (NOT AT ARMC): D-Dimer, Quant: 0.46 ug/mL-FEU (ref 0.00–0.50)

## 2019-07-13 LAB — BASIC METABOLIC PANEL
Anion gap: 11 (ref 5–15)
BUN: 8 mg/dL (ref 6–20)
CO2: 22 mmol/L (ref 22–32)
Calcium: 8.9 mg/dL (ref 8.9–10.3)
Chloride: 105 mmol/L (ref 98–111)
Creatinine, Ser: 0.65 mg/dL (ref 0.44–1.00)
GFR calc Af Amer: >60 mL/min (ref >60)
GFR calc non Af Amer: >60 mL/min (ref >60)
Glucose, Bld: 99 mg/dL (ref 70–99)
Potassium: 3.7 mmol/L (ref 3.5–5.1)
Sodium: 138 mmol/L (ref 135–145)

## 2019-07-13 LAB — CBC
HCT: 43.1 % (ref 36.0–46.0)
Hemoglobin: 14.2 g/dL (ref 12.0–15.0)
MCH: 29 pg (ref 26.0–34.0)
MCHC: 32.9 g/dL (ref 30.0–36.0)
MCV: 88.1 fL (ref 80.0–100.0)
Platelets: 256 10*3/uL (ref 150–400)
RBC: 4.89 MIL/uL (ref 3.87–5.11)
RDW: 12.9 % (ref 11.5–15.5)
WBC: 6.5 10*3/uL (ref 4.0–10.5)
nRBC: 0 % (ref 0.0–0.2)

## 2019-07-13 LAB — TROPONIN I (HIGH SENSITIVITY)
Troponin I (High Sensitivity): 6 ng/L (ref <18)
Troponin I (High Sensitivity): 4 ng/L (ref <18)

## 2019-07-13 MED ORDER — ASPIRIN 81 MG PO CHEW
324.0000 mg | CHEWABLE_TABLET | Freq: Once | ORAL | Status: AC
  Administered 2019-07-13: 11:00:00 324 mg via ORAL
  Filled 2019-07-13: qty 4

## 2019-07-13 NOTE — Progress Notes (Signed)
Left lower ext venous  has been completed. Refer to [facility] under chart review to view preliminary results.   07/13/2019  11:38 AM Kolleen [facility]ner, Bonnye Fava

## 2019-07-13 NOTE — Discharge Instructions (Addendum)
Your work-up today was reassuring with no evidence of blood clot in the lungs or active heart attack.  Continue taking your home medications as prescribed. You can take 1 to 2 tablets of Tylenol (350mg -1000mg  depending on the dose) every 6 hours as needed for pain.  Do not exceed 4000 mg of Tylenol daily.  If your pain persists you can take a doses of ibuprofen in between doses of Tylenol.  I usually recommend 400 to 600 mg of ibuprofen every 6 hours.  Take this with food to avoid upset stomach issues.  Drink plenty of fluids and get plenty of rest.  With regards to your leg swelling, you can wear compression stockings during the day while you are active/standing.  When you are not walking, elevate the legs to try to reduce swelling.  Call your primary care provider to set up a follow-up movement for reevaluation of your symptoms.  Return to the emergency department if any concerning signs or symptoms develop such as high fevers, severe chest pain or shortness of breath, loss of consciousness.

## 2019-07-13 NOTE — ED Triage Notes (Signed)
Pt here from home with c/o chest pain and left leg pain and numbness , pt has been taking lasix for approx 2 months for swelling in that leg

## 2019-07-13 NOTE — ED Provider Notes (Signed)
[facility] EMERGENCY DEPARTMENT Provider Note   CSN: [id number] Arrival date & time: 07/13/19  4098     History   Chief Complaint Chief Complaint  Patient presents with   Chest Pain   Leg Pain    HPI Annette Cowan is a 43 y.o. female with history of hypertension, migraines presents for evaluation of acute onset, intermittent chest pain since yesterday.  She reports symptoms began around 7 PM while cooking dinner.  Sensation is primarily substernal radiates to the left side a little bit.  It is associated with some lightheadedness, diaphoresis, and shortness of breath.  No aggravating or alleviating factors noted.  She describes a sensation as more of a tightness.  Today she developed some left upper extremity pain and numbness, so she called her PCP who recommended presentation to the ED for further evaluation.  She also reports that she has had persistent left lower extremity swelling for the last 2 months.  She was initially placed on HCTZ 25 mg the first month without relief of symptoms, then switch to furosemide 1 month ago and recently had a dosage increase.  She denies fever, cough, abdominal pain, nausea, vomiting.  She is a non-smoker, denies recreational drug use or excessive alcohol intake.  No recent travel or surgeries, no hemoptysis, no prior history of DVT or PE.  She is on the Mirena IUD, no OCP use or hormone replacement otherwise.  Has not tried anything for her symptoms.     The history is provided by the patient.    Past Medical History:  Diagnosis Date   High blood pressure    Migraine    Preeclampsia in postpartum period     There are no active problems to display for this patient.   Past Surgical History:  Procedure Laterality Date   KIDNEY STONE SURGERY       OB History   No obstetric history on file.      Home Medications    Prior to Admission medications   Medication Sig Start Date End Date Taking? Authorizing  Provider  albuterol (PROAIR HFA) 108 (90 Base) MCG/ACT inhaler Inhale two puffs every four to six hours as needed for cough or wheeze.   Yes [provider]  diazepam (VALIUM) 5 MG tablet Take 5 mg by mouth every 12 (twelve) hours as needed for anxiety.   Yes [provider]  fluticasone (FLONASE) 50 MCG/ACT nasal spray Place 2 sprays into both nostrils daily.  01/04/17  Yes [provider]  furosemide (LASIX) 20 MG tablet Take 20 mg by mouth 2 (two) times daily.   Yes [provider]  lisinopril (PRINIVIL,ZESTRIL) 10 MG tablet Take 10 mg by mouth daily.  01/08/17  Yes [provider]  loratadine (CLARITIN) 10 MG tablet Take 10 mg by mouth daily.   Yes [provider]  meloxicam (MOBIC) 15 MG tablet TAKE 1 TABLET BY MOUTH EVERY DAY 07/03/19  Yes Felecia Shelling, DPM  montelukast (SINGULAIR) 10 MG tablet Take 10 mg by mouth at bedtime.  01/08/17  Yes [provider]  Multiple Vitamins-Minerals (MULTIVITAMIN ADULT PO) Take by mouth.   Yes [provider]  omeprazole (PRILOSEC) 40 MG capsule TAKE 1 CAPSULE BY MOUTH EVERY DAY 11/17/17  Yes Kozlow, Alvira Philips, MD  sertraline (ZOLOFT) 100 MG tablet Take 100 mg by mouth daily.  01/08/17  Yes [provider]  azelastine (OPTIVAR) 0.05 % ophthalmic solution Place 1 drop into both eyes 2 (  two) times daily as needed.    [provider]  fluconazole (DIFLUCAN) 150 MG tablet TAKE 1 TABLET WEEKLY 03/31/17   [provider]  Fluticasone Furoate 100 MCG/ACT AEPB INHALE 1 PUFF INTO THE LUNGS EVERY DAY 12/06/17   Kozlow, Alvira PhilipsEric J, MD  levonorgestrel (MIRENA, 52 MG,) 20 MCG/24HR IUD Mirena 20 mcg/24 hr (5 years) intrauterine device  Take 1 device by intrauterine route.    [provider]  methylPREDNISolone (MEDROL DOSEPAK) 4 MG TBPK tablet 6 day dose pack - take as directed Patient not taking: Reported on 07/13/2019 05/08/19   Felecia ShellingEvans, Brent M, DPM    Family History Family  History  Problem Relation Age of Onset   High blood pressure Mother    Colon cancer Maternal Grandmother    Heart disease Maternal Grandfather    Diabetes Maternal Grandfather    Asthma Daughter    Asthma Son     Social History Social History   Tobacco Use   Smoking status: Never Smoker   Smokeless tobacco: Never Used  Substance Use Topics   Alcohol use: No   Drug use: No     Allergies   Patient has no known allergies.   Review of Systems Review of Systems  Constitutional: Positive for diaphoresis. Negative for chills and fever.  Respiratory: Positive for chest tightness and shortness of breath. Negative for cough.   Cardiovascular: Positive for chest pain and leg swelling. Negative for palpitations.  Gastrointestinal: Negative for abdominal pain, nausea and vomiting.  Neurological: Positive for light-headedness. Negative for syncope and headaches.  All other systems reviewed and are negative.    Physical Exam Updated Vital Signs BP 125/82    Pulse 84    Temp 98.8 F (37.1 C) (Oral)    Resp 15    SpO2 99%   Physical Exam Vitals signs and nursing note reviewed.  Constitutional:      General: She is not in acute distress.    Appearance: She is well-developed.  HENT:     Head: Normocephalic and atraumatic.  Eyes:     General:        Right eye: No discharge.        Left eye: No discharge.     Conjunctiva/sclera: Conjunctivae normal.  Neck:     Musculoskeletal: Normal range of motion and neck supple.     Vascular: No JVD.     Trachea: No tracheal deviation.  Cardiovascular:     Rate and Rhythm: Normal rate and regular rhythm.     Pulses:          Radial pulses are 2+ on the right side and 2+ on the left side.       Dorsalis pedis pulses are 2+ on the right side and 2+ on the left side.       Posterior tibial pulses are 2+ on the right side and 2+ on the left side.     Heart sounds: Normal heart sounds.  Pulmonary:     Effort: Pulmonary effort is  normal. No tachypnea or accessory muscle usage.     Breath sounds: No decreased breath sounds.     Comments: Speaking in full sentences without difficulty, SPO2 saturations 96% on room air Chest:     Chest wall: No mass or tenderness.  Abdominal:     General: There is no distension.     Palpations: Abdomen is soft.     Tenderness: There is no abdominal tenderness.  Musculoskeletal:  Right lower leg: She exhibits no tenderness. No edema.     Left lower leg: She exhibits tenderness. Edema present.     Comments: Left calf measures 37 cm on the lower part, right calf measures 36 cm.  Homans sign present on the left.  No palpable cords, compartments are soft  Skin:    General: Skin is warm and dry.     Findings: No erythema.  Neurological:     Mental Status: She is alert.  Psychiatric:        Behavior: Behavior normal.      ED Treatments / Results  Labs (all labs ordered are listed, but only abnormal results are displayed) Labs Reviewed  BASIC METABOLIC PANEL  CBC  D-DIMER, QUANTITATIVE (NOT AT [facility])  TROPONIN I (HIGH SENSITIVITY)  TROPONIN I (HIGH SENSITIVITY)    EKG ED ECG REPORT   Date: 07/13/2019  Rate: 80  Rhythm: normal sinus rhythm  QRS Axis: normal  Intervals: normal  ST/T Wave abnormalities: normal  Conduction Disutrbances:none  Narrative Interpretation:   Old EKG Reviewed: none available  I have personally reviewed the EKG tracing and agree with the computerized printout as noted.   Radiology Dg Chest Port 1 View  Result Date: 07/13/2019 CLINICAL DATA:  Chest pain and LEFT leg pain with numbness, LEFT leg swelling on Lasix, hypertension EXAM: PORTABLE CHEST 1 VIEW COMPARISON:  Portable exam 1017 hours compared to 03/22/2017 FINDINGS: Normal heart size, mediastinal contours, and pulmonary vascularity. Lungs clear. No pleural effusion or pneumothorax. Bones unremarkable. IMPRESSION: No acute abnormalities. Electronically Signed   By: Lavonia Dana M.D.   On:  07/13/2019 10:23   Vas [place] Lower Extremity Venous (dvt) (mc And Wl 7a-7p)  Result Date: 07/13/2019  Lower Venous Study Indications: Increase swelling in her left calf for two months.  Comparison Study: No priors. Performing Technologist: Oda Cogan RDMS, RVT  Examination Guidelines: A complete evaluation includes B-mode imaging, spectral Doppler, color Doppler, and power Doppler as needed of all accessible portions of each vessel. Bilateral testing is considered an integral part of a complete examination. Limited examinations for reoccurring indications may be performed as noted.  +-----+---------------+---------+-----------+----------+--------------+  RIGHT Compressibility Phasicity Spontaneity Properties Thrombus Aging  +-----+---------------+---------+-----------+----------+--------------+  CFV   Full            Yes       Yes                                    +-----+---------------+---------+-----------+----------+--------------+  SFJ   Full                                                             +-----+---------------+---------+-----------+----------+--------------+   +---------+---------------+---------+-----------+----------+--------------+  LEFT      Compressibility Phasicity Spontaneity Properties Thrombus Aging  +---------+---------------+---------+-----------+----------+--------------+  CFV       Full            Yes       Yes                                    +---------+---------------+---------+-----------+----------+--------------+  SFJ       Full                                                             +---------+---------------+---------+-----------+----------+--------------+  FV Prox   Full                                                             +---------+---------------+---------+-----------+----------+--------------+  FV Mid    Full                                                             +---------+---------------+---------+-----------+----------+--------------+  FV  Distal Full                                                             +---------+---------------+---------+-----------+----------+--------------+  PFV       Full                                                             +---------+---------------+---------+-----------+----------+--------------+  POP       Full            Yes       Yes                                    +---------+---------------+---------+-----------+----------+--------------+  PTV       Full                                                             +---------+---------------+---------+-----------+----------+--------------+  PERO      Full                                                             +---------+---------------+---------+-----------+----------+--------------+     Summary: Right: No evidence of common femoral vein obstruction. Left: There is no evidence of deep vein thrombosis in the lower extremity. No cystic structure found in the popliteal fossa.  *See table(s) above for measurements and observations.    Preliminary     Procedures Procedures (including critical care time)  Medications Ordered in ED Medications  aspirin chewable tablet 324 mg (324 mg Oral Given 07/13/19 1032)     Initial Impression / Assessment and Plan / ED Course  I have reviewed the triage vital signs and the nursing notes.  Pertinent labs & imaging results that were available during my care of the patient were reviewed by me and considered in my medical decision making (see chart  for details).        Patient presenting for evaluation of chest pains intermittently since yesterday, leg swelling that has been present for 2 months.  She is afebrile, vital signs are stable.  She is nontoxic in appearance.  She is neurovascularly intact.  The pain is not exertional, pleuritic, or reproducible on palpation.  Lab work reviewed by me shows no leukocytosis, no anemia, no metabolic derangements, no renal insufficiency.  Her chest x-ray reviewed  independently by myself shows no evidence of cardiomegaly, effusion, or infiltrates.  DVT study was negative and her d-dimer is also negative so I have a low suspicion of PE.  EKG shows no acute ischemic abnormalities, no arrhythmia.  Serial troponins are negative and I doubt ACS/MI.  Low suspicion of dissection, cardiac tamponade, esophageal rupture, pneumothorax, or pneumonia.  No abdominal pain to suggest acute surgical abdominal pathology.  On reevaluation she is resting comfortably in no apparent distress.  No further emergent work-up required at this time.  Recommend discharge home with close PCP follow-up.  Discussed strict ED return precautions. Patient verbalized understanding of and agreement with plan and is safe for discharge home at this time.   Final Clinical Impressions(s) / ED Diagnoses   Final diagnoses:  Atypical chest pain  Lower extremity edema    ED Discharge Orders    None       Bennye AlmFawze, Haddon Fyfe A, PA-C 07/13/19 1326    Benjiman CorePickering, Nathan, MD 07/13/19 1524

## 2019-07-26 ENCOUNTER — Other Ambulatory Visit: Payer: Self-pay | Admitting: Podiatry

## 2019-08-28 ENCOUNTER — Ambulatory Visit: Payer: BC Managed Care – PPO | Admitting: Podiatry

## 2019-12-04 ENCOUNTER — Ambulatory Visit: Payer: BC Managed Care – PPO | Admitting: Podiatry

## 2019-12-04 ENCOUNTER — Other Ambulatory Visit: Payer: Self-pay

## 2019-12-04 ENCOUNTER — Ambulatory Visit (INDEPENDENT_AMBULATORY_CARE_PROVIDER_SITE_OTHER): Payer: BC Managed Care – PPO

## 2019-12-04 DIAGNOSIS — M722 Plantar fascial fibromatosis: Secondary | ICD-10-CM

## 2019-12-04 DIAGNOSIS — M7751 Other enthesopathy of right foot: Secondary | ICD-10-CM

## 2019-12-04 DIAGNOSIS — M21611 Bunion of right foot: Secondary | ICD-10-CM | POA: Diagnosis not present

## 2019-12-04 MED ORDER — METHYLPREDNISOLONE 4 MG PO TBPK: ORAL_TABLET | ORAL | 0 refills | Status: DC

## 2019-12-04 MED ORDER — MELOXICAM 15 MG PO TABS: 15.0000 mg | ORAL_TABLET | Freq: Every day | ORAL | 2 refills | Status: DC

## 2019-12-07 NOTE — Progress Notes (Signed)
   Subjective: 44 y.o. female presents today for follow up evaluation of plantar fasciitis of the right foot. She also notes a flare up of throbbing pain to the bunion of the right foot. Walking and being on the foot increases the pain. She has not had any recent treatment for the symptoms but has been using the fascial brace as directed. Patient is here for further evaluation and treatment.   Past Medical History:  Diagnosis Date  . High blood pressure   . Migraine   . Preeclampsia in postpartum period       Objective: Physical Exam General: The patient is alert and oriented x3 in no acute distress.  Dermatology: Skin is cool, dry and supple bilateral lower extremities. Negative for open lesions or macerations.  Vascular: Palpable pedal pulses bilaterally. No edema or erythema noted. Capillary refill within normal limits.  Neurological: Epicritic and protective threshold grossly intact bilaterally.   Musculoskeletal Exam: Clinical evidence of bunion deformity noted to the respective foot. There is moderate pain on palpation range of motion of the first MPJ. Lateral deviation of the hallux noted consistent with hallux abductovalgus. Pain with palpation noted to the right heel along the plantar fascia.   Radiographic Exam: Increased intermetatarsal angle greater than 15 with a hallux abductus angle greater than 30 noted on AP view. Moderate degenerative changes noted within the first MPJ.  Assessment: 1. 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 Plantar fasciitis right     Plan of Care:  1. Patient was evaluated. X-Rays reviewed. 2. Injection of 0.5 mLs Celestone Soluspan injected into the 1st MPJ of the right foot.  3. Injection of 0.5 mLs Celestone Soluspan injected into the right heel.  4. Prescription for Medrol Dose Pak provided to patient. 5. Prescription for Meloxicam provided to patient. 6. Continue using plantar fascial brace.  7. Return to clinic  in 4 weeks.   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       Felecia Shelling, DPM [facility]  Dr. Felecia Shelling, DPM    [address]                                        [place], [place] [address]                Office [phone]  Fax [phone]

## 2020-01-01 ENCOUNTER — Other Ambulatory Visit: Payer: Self-pay | Admitting: Family Medicine

## 2020-01-01 ENCOUNTER — Other Ambulatory Visit: Payer: Self-pay

## 2020-01-01 ENCOUNTER — Ambulatory Visit: Payer: Self-pay

## 2020-01-01 DIAGNOSIS — M545 Low back pain, unspecified: Secondary | ICD-10-CM

## 2020-01-01 DIAGNOSIS — M25551 Pain in right hip: Secondary | ICD-10-CM

## 2020-01-01 DIAGNOSIS — M25561 Pain in right knee: Secondary | ICD-10-CM

## 2020-01-13 ENCOUNTER — Ambulatory Visit: Payer: BC Managed Care – PPO | Attending: Internal Medicine

## 2020-01-13 DIAGNOSIS — Z23 Encounter for immunization: Secondary | ICD-10-CM | POA: Insufficient documentation

## 2020-01-13 NOTE — Progress Notes (Signed)
   Covid-19 Vaccination Clinic  Name:  EMMERIE BATTAGLIA    MRN: [id number] DOB: Aug 23, 1976  01/13/2020  Ms. Bonito was observed post Covid-19 immunization for 15 minutes without incidence. She was provided with Vaccine Information Sheet and instruction to access the V-Safe system.   Ms. Finder was instructed to call 911 with any severe reactions post vaccine: [name] Difficulty breathing  . Swelling of your face and throat  . A fast heartbeat  . A bad rash all over your body  . Dizziness and weakness    Immunizations Administered    Name Date Dose VIS Date Route   Pfizer COVID-19 Vaccine 01/13/2020  4:20 PM 0.3 mL 10/27/2019 Intramuscular   Manufacturer: [employer], [employer]   Lot: [id number]   NDC: [address]-2

## 2020-02-03 ENCOUNTER — Other Ambulatory Visit: Payer: Self-pay

## 2020-02-03 ENCOUNTER — Ambulatory Visit: Payer: BC Managed Care – PPO | Attending: Internal Medicine

## 2020-02-03 DIAGNOSIS — Z23 Encounter for immunization: Secondary | ICD-10-CM

## 2020-02-03 NOTE — Progress Notes (Signed)
   Covid-19 Vaccination Clinic  Name:  [name]    MRN: [id number] DOB: 09-Sep-1976  02/03/2020  [name] was observed post Covid-19 immunization for 15 minutes without incident. She was provided with Vaccine Information Sheet and instruction to access the V-Safe system.   [name] was instructed to call 911 with any severe reactions post vaccine: [name] Difficulty breathing  . Swelling of face and throat  . A fast heartbeat  . A bad rash all over body  . Dizziness and weakness   Immunizations Administered    Name Date Dose VIS Date Route   Pfizer COVID-19 Vaccine 02/03/2020  8:37 AM 0.3 mL 10/27/2019 Intramuscular   Manufacturer: [employer], [employer]   Lot: [id number]   NDC: [address]-8

## 2020-02-23 ENCOUNTER — Ambulatory Visit
Admission: RE | Admit: 2020-02-23 | Discharge: 2020-02-23 | Disposition: A | Discharge status: Home / Self Care | Payer: BC Managed Care – PPO | Source: Ambulatory Visit | Attending: Physician Assistant | Admitting: Physician Assistant

## 2020-02-23 ENCOUNTER — Other Ambulatory Visit: Payer: Self-pay | Admitting: Physician Assistant

## 2020-02-23 DIAGNOSIS — R0602 Shortness of breath: Secondary | ICD-10-CM

## 2020-02-23 DIAGNOSIS — R0789 Other chest pain: Secondary | ICD-10-CM

## 2020-04-16 NOTE — Progress Notes (Signed)
Pt. Needs orders for the upcomming surgery.PST interview appointment on: 04/17/20.Thank you.

## 2020-04-16 NOTE — Patient Instructions (Addendum)
DUE TO COVID-19 ONLY ONE VISITOR IS ALLOWED IN WAITING ROOM (VISITOR WILL HAVE A TEMPERATURE CHECK ON ARRIVAL AND MUST WEAR A FACE MASK THE ENTIRE TIME.)  ONCE YOU ARE ADMITTED TO YOUR PRIVATE ROOM, THE SAME ONE VISITOR IS ALLOWED TO VISIT DURING VISITING HOURS ONLY.  Your COVID swab testing is scheduled for: 04/22/20 At  11:30 am  , You must self quarantine after your testing per handout given to you at the testing site.  ([place] up testing enter pre-surgical testing line)    Your procedure is scheduled on:04/25/20  Report to [place] AT: 5:30  A. M.   Call this number if you have problems the morning of surgery:[phone].   OUR ADDRESS IS [place].  WE ARE LOCATED IN THE [place]                                   [facility].                                     REMEMBER:   DO NOT EAT FOOD OR DRINK LIQUIDS AFTER MIDNIGHT .    BRUSH YOUR TEETH THE MORNING OF SURGERY.  TAKE THESE MEDICATIONS MORNING OF SURGERY WITH A SIP OF WATER:  Loratadine,omeprazole,sertraline.Use inhalers as usual. Diazepam,flonase as needed.  DO NOT WEAR JEWERLY, MAKE UP, OR NAIL POLISH.  DO NOT WEAR LOTIONS, POWDERS, PERFUMES/COLOGNE OR DEODORANT.  DO NOT SHAVE FOR 24 HOURS PRIOR TO DAY OF SURGERY.    CONTACTS, GLASSES, OR DENTURES MAY NOT BE WORN TO SURGERY.                                    [facility] IS NOT RESPONSIBLE  FOR ANY BELONGINGS.          BRING ALL PRESCRIPTION MEDICATIONS WITH YOU THE DAY OF SURGERY IN ORIGINAL CONTAINERS                              YOU MAY BRING A SMALL OVERNIGHT BAG                                   .[facility] - Preparing for Surgery Before surgery, you can play an important role.  Because skin is not sterile, your skin needs to be as free of germs as possible.  You can reduce the number of germs on your skin by washing with CHG (chlorahexidine gluconate) soap before surgery.   CHG is an antiseptic cleaner which kills germs and bonds with the skin to continue killing germs even after washing. Please DO NOT use if you have an allergy to CHG or antibacterial soaps.  If your skin becomes reddened/irritated stop using the CHG and inform your nurse when you arrive at Short Stay. Do not shave (including legs and underarms) for at least 48 hours prior to the first CHG shower.  You may shave your face/neck. Please follow these instructions carefully:  1.  Shower with CHG Soap the night before surgery and the  morning of Surgery.  2.  If you choose  to wash your hair, wash your hair first as usual with your  normal  shampoo.  3.  After you shampoo, rinse your hair and body thoroughly to remove the  shampoo.                           4.  Use CHG as you would any other liquid soap.  You can apply chg directly  to the skin and wash                       Gently with a scrungie or clean washcloth.  5.  Apply the CHG Soap to your body ONLY FROM THE NECK DOWN.   Do not use on face/ open                           Wound or open sores. Avoid contact with eyes, ears mouth and genitals (private parts).                       Wash face,  Genitals (private parts) with your normal soap.             6.  Wash thoroughly, paying special attention to the area where your surgery  will be performed.  7.  Thoroughly rinse your body with warm water from the neck down.  8.  DO NOT shower/wash with your normal soap after using and rinsing off  the CHG Soap.                9.  Pat yourself dry with a clean towel.            10.  Wear clean pajamas.            11.  Place clean sheets on your bed the night of your first shower and do not  sleep with pets. Day of Surgery : Do not apply any lotions/deodorants the morning of surgery.  Please wear clean clothes to the hospital/surgery center.  FAILURE TO FOLLOW THESE INSTRUCTIONS MAY RESULT IN THE CANCELLATION OF YOUR SURGERY PATIENT  SIGNATURE_________________________________  NURSE SIGNATURE__________________________________  ________________________________________________________________________

## 2020-04-17 ENCOUNTER — Encounter (HOSPITAL_COMMUNITY)
Admission: RE | Admit: 2020-04-17 | Discharge: 2020-04-17 | Disposition: A | Discharge status: Home / Self Care | Payer: BC Managed Care – PPO | Source: Ambulatory Visit | Attending: Obstetrics and Gynecology | Admitting: Obstetrics and Gynecology

## 2020-04-17 ENCOUNTER — Other Ambulatory Visit: Payer: Self-pay

## 2020-04-17 ENCOUNTER — Encounter (HOSPITAL_COMMUNITY): Payer: Self-pay

## 2020-04-17 DIAGNOSIS — Z01812 Encounter for preprocedural laboratory examination: Secondary | ICD-10-CM | POA: Diagnosis not present

## 2020-04-17 HISTORY — DX: Unspecified asthma, uncomplicated: J45.909

## 2020-04-17 HISTORY — DX: Anxiety disorder, unspecified: F41.9

## 2020-04-17 HISTORY — DX: Depression, unspecified: F32.A

## 2020-04-17 LAB — BASIC METABOLIC PANEL
Anion gap: 10 (ref 5–15)
BUN: 13 mg/dL (ref 6–20)
CO2: 27 mmol/L (ref 22–32)
Calcium: 9.2 mg/dL (ref 8.9–10.3)
Chloride: 103 mmol/L (ref 98–111)
Creatinine, Ser: 0.81 mg/dL (ref 0.44–1.00)
GFR calc Af Amer: >60 mL/min (ref >60)
GFR calc non Af Amer: >60 mL/min (ref >60)
Glucose, Bld: 162 mg/dL — ABNORMAL HIGH (ref 70–99)
Potassium: 3.8 mmol/L (ref 3.5–5.1)
Sodium: 140 mmol/L (ref 135–145)

## 2020-04-17 LAB — CBC
HCT: 45.1 % (ref 36.0–46.0)
Hemoglobin: 14.8 g/dL (ref 12.0–15.0)
MCH: 28.8 pg (ref 26.0–34.0)
MCHC: 32.8 g/dL (ref 30.0–36.0)
MCV: 87.9 fL (ref 80.0–100.0)
Platelets: 268 10*3/uL (ref 150–400)
RBC: 5.13 MIL/uL — ABNORMAL HIGH (ref 3.87–5.11)
RDW: 12.8 % (ref 11.5–15.5)
WBC: 7.7 10*3/uL (ref 4.0–10.5)
nRBC: 0 % (ref 0.0–0.2)

## 2020-04-17 LAB — ABO/RH: ABO/RH(D): O POS

## 2020-04-17 NOTE — Progress Notes (Signed)
COVID Vaccine Completed:yes Date COVID Vaccine completed:02/03/20 COVID vaccine manufacturer: *Pfizer    [facility] [employer]   PCP - Noelle Redmon PA. LOV: 12/04/19 Cardiologist -   Chest x-ray - 02/23/20. EPIC EKG - 07/17/19 EPIC Stress Test -  ECHO -  Cardiac Cath -   Sleep Study -  CPAP -   Fasting Blood Sugar -  Checks Blood Sugar _____ times a day  Blood Thinner Instructions: Aspirin Instructions: Last Dose:  Anesthesia review:   Patient denies shortness of breath, fever, cough and chest pain at PAT appointment   Patient verbalized understanding of instructions that were given to them at the PAT appointment. Patient was also instructed that they will need to review over the PAT instructions again at home before surgery.

## 2020-04-22 ENCOUNTER — Other Ambulatory Visit (HOSPITAL_COMMUNITY)
Admission: RE | Admit: 2020-04-22 | Discharge: 2020-04-22 | Disposition: A | Discharge status: Home / Self Care | Payer: BC Managed Care – PPO | Source: Ambulatory Visit | Attending: Obstetrics and Gynecology | Admitting: Obstetrics and Gynecology

## 2020-04-22 DIAGNOSIS — Z20822 Contact with and (suspected) exposure to covid-19: Secondary | ICD-10-CM | POA: Insufficient documentation

## 2020-04-22 DIAGNOSIS — Z01812 Encounter for preprocedural laboratory examination: Secondary | ICD-10-CM | POA: Diagnosis present

## 2020-04-22 LAB — SARS CORONAVIRUS 2 (TAT 6-24 HRS): SARS Coronavirus 2: NEGATIVE

## 2020-04-24 NOTE — Anesthesia Preprocedure Evaluation (Addendum)
Anesthesia Evaluation  Patient identified by MRN, date of birth, ID band Patient awake    Reviewed: Allergy & Precautions, NPO status , Patient's Chart, lab work & pertinent test results  History of Anesthesia Complications Negative for: history of anesthetic complications  Airway Mallampati: III  TM Distance: >3 FB     Dental no notable dental hx. (+) Dental Advisory Given   Pulmonary asthma ,    Pulmonary exam normal        Cardiovascular hypertension, Pt. on medications Normal cardiovascular exam     Neuro/Psych  Headaches, PSYCHIATRIC DISORDERS Anxiety Depression    GI/Hepatic negative GI ROS, Neg liver ROS,   Endo/Other  negative endocrine ROS  Renal/GU negative Renal ROS     Musculoskeletal negative musculoskeletal ROS (+)   Abdominal   Peds  Hematology negative hematology ROS (+)   Anesthesia Other Findings   Reproductive/Obstetrics                            Anesthesia Physical Anesthesia Plan  ASA: II  Anesthesia Plan: General   Post-op Pain Management:    Induction: Intravenous  PONV Risk Score and Plan: 4 or greater and Ondansetron, Dexamethasone, Midazolam and Scopolamine patch - Pre-op  Airway Management Planned:   Additional Equipment:   Intra-op Plan:   Post-operative Plan: Extubation in OR  Informed Consent: I have reviewed the patients History and Physical, chart, labs and discussed the procedure including the risks, benefits and alternatives for the proposed anesthesia with the patient or authorized representative who has indicated his/her understanding and acceptance.     Dental advisory given  Plan Discussed with: Anesthesiologist  Anesthesia Plan Comments:        Anesthesia Quick Evaluation

## 2020-04-25 ENCOUNTER — Ambulatory Visit (HOSPITAL_BASED_OUTPATIENT_CLINIC_OR_DEPARTMENT_OTHER): Payer: BC Managed Care – PPO | Admitting: Anesthesiology

## 2020-04-25 ENCOUNTER — Ambulatory Visit (HOSPITAL_BASED_OUTPATIENT_CLINIC_OR_DEPARTMENT_OTHER)
Admission: RE | Admit: 2020-04-25 | Discharge: 2020-04-26 | Disposition: A | Discharge status: Home / Self Care | Payer: BC Managed Care – PPO | Attending: Obstetrics and Gynecology | Admitting: Obstetrics and Gynecology

## 2020-04-25 ENCOUNTER — Other Ambulatory Visit: Payer: Self-pay

## 2020-04-25 ENCOUNTER — Encounter (HOSPITAL_BASED_OUTPATIENT_CLINIC_OR_DEPARTMENT_OTHER)
Admission: RE | Disposition: A | Discharge status: Home / Self Care | Payer: Self-pay | Source: Home / Self Care | Attending: Obstetrics and Gynecology

## 2020-04-25 ENCOUNTER — Encounter (HOSPITAL_BASED_OUTPATIENT_CLINIC_OR_DEPARTMENT_OTHER): Payer: Self-pay | Admitting: Obstetrics and Gynecology

## 2020-04-25 DIAGNOSIS — R87612 Low grade squamous intraepithelial lesion on cytologic smear of cervix (LGSIL): Secondary | ICD-10-CM | POA: Diagnosis not present

## 2020-04-25 DIAGNOSIS — I10 Essential (primary) hypertension: Secondary | ICD-10-CM | POA: Insufficient documentation

## 2020-04-25 DIAGNOSIS — J45909 Unspecified asthma, uncomplicated: Secondary | ICD-10-CM | POA: Diagnosis not present

## 2020-04-25 DIAGNOSIS — F419 Anxiety disorder, unspecified: Secondary | ICD-10-CM | POA: Insufficient documentation

## 2020-04-25 DIAGNOSIS — Z975 Presence of (intrauterine) contraceptive device: Secondary | ICD-10-CM | POA: Insufficient documentation

## 2020-04-25 DIAGNOSIS — N939 Abnormal uterine and vaginal bleeding, unspecified: Secondary | ICD-10-CM | POA: Diagnosis present

## 2020-04-25 DIAGNOSIS — Z825 Family history of asthma and other chronic lower respiratory diseases: Secondary | ICD-10-CM | POA: Insufficient documentation

## 2020-04-25 DIAGNOSIS — G43909 Migraine, unspecified, not intractable, without status migrainosus: Secondary | ICD-10-CM | POA: Insufficient documentation

## 2020-04-25 DIAGNOSIS — Z833 Family history of diabetes mellitus: Secondary | ICD-10-CM | POA: Insufficient documentation

## 2020-04-25 DIAGNOSIS — Z8759 Personal history of other complications of pregnancy, childbirth and the puerperium: Secondary | ICD-10-CM | POA: Diagnosis not present

## 2020-04-25 DIAGNOSIS — Z87442 Personal history of urinary calculi: Secondary | ICD-10-CM | POA: Insufficient documentation

## 2020-04-25 DIAGNOSIS — Z79899 Other long term (current) drug therapy: Secondary | ICD-10-CM | POA: Insufficient documentation

## 2020-04-25 DIAGNOSIS — Z793 Long term (current) use of hormonal contraceptives: Secondary | ICD-10-CM | POA: Insufficient documentation

## 2020-04-25 DIAGNOSIS — N946 Dysmenorrhea, unspecified: Secondary | ICD-10-CM | POA: Insufficient documentation

## 2020-04-25 DIAGNOSIS — Z800 Family history of malignant neoplasm of digestive organs: Secondary | ICD-10-CM | POA: Insufficient documentation

## 2020-04-25 DIAGNOSIS — F329 Major depressive disorder, single episode, unspecified: Secondary | ICD-10-CM | POA: Insufficient documentation

## 2020-04-25 DIAGNOSIS — Z7952 Long term (current) use of systemic steroids: Secondary | ICD-10-CM | POA: Insufficient documentation

## 2020-04-25 DIAGNOSIS — Z791 Long term (current) use of non-steroidal anti-inflammatories (NSAID): Secondary | ICD-10-CM | POA: Insufficient documentation

## 2020-04-25 DIAGNOSIS — Z8249 Family history of ischemic heart disease and other diseases of the circulatory system: Secondary | ICD-10-CM | POA: Diagnosis not present

## 2020-04-25 HISTORY — PX: VAGINAL HYSTERECTOMY: SHX2639

## 2020-04-25 HISTORY — PX: CYSTOSCOPY: SHX5120

## 2020-04-25 LAB — POCT PREGNANCY, URINE: Preg Test, Ur: NEGATIVE

## 2020-04-25 LAB — TYPE AND SCREEN
ABO/RH(D): O POS
Antibody Screen: NEGATIVE

## 2020-04-25 SURGERY — HYSTERECTOMY, VAGINAL
Anesthesia: General

## 2020-04-25 MED ORDER — SOD CITRATE-CITRIC ACID 500-334 MG/5ML PO SOLN: 30.0000 mL | ORAL | Status: AC

## 2020-04-25 MED ORDER — OXYCODONE HCL 5 MG PO TABS
ORAL_TABLET | ORAL | Status: AC
  Filled 2020-04-25: qty 2

## 2020-04-25 MED ORDER — KETOROLAC TROMETHAMINE 30 MG/ML IJ SOLN
30.0000 mg | Freq: Four times a day (QID) | INTRAMUSCULAR | Status: AC
  Administered 2020-04-25 – 2020-04-26 (×4): 30 mg via INTRAVENOUS

## 2020-04-25 MED ORDER — SERTRALINE HCL 100 MG PO TABS
100.0000 mg | ORAL_TABLET | Freq: Every day | ORAL | Status: DC
  Filled 2020-04-25: qty 1

## 2020-04-25 MED ORDER — FLUORESCEIN SODIUM 10 % IV SOLN
INTRAVENOUS | Status: DC | PRN
Start: 2020-04-25 — End: 2020-04-25
  Administered 2020-04-25: 300 mg via INTRAVENOUS

## 2020-04-25 MED ORDER — DEXAMETHASONE SODIUM PHOSPHATE 10 MG/ML IJ SOLN
INTRAMUSCULAR | Status: AC
  Filled 2020-04-25: qty 1

## 2020-04-25 MED ORDER — MENTHOL 3 MG MT LOZG: 1.0000 | LOZENGE | OROMUCOSAL | Status: DC | PRN

## 2020-04-25 MED ORDER — PROPOFOL 500 MG/50ML IV EMUL
INTRAVENOUS | Status: AC
  Filled 2020-04-25: qty 50

## 2020-04-25 MED ORDER — DOCUSATE SODIUM 100 MG PO CAPS
ORAL_CAPSULE | ORAL | Status: AC
  Filled 2020-04-25: qty 1

## 2020-04-25 MED ORDER — IBUPROFEN 800 MG PO TABS: 800.0000 mg | ORAL_TABLET | Freq: Three times a day (TID) | ORAL | 1 refills | Status: AC | PRN

## 2020-04-25 MED ORDER — DEXAMETHASONE SODIUM PHOSPHATE 10 MG/ML IJ SOLN
INTRAMUSCULAR | Status: DC | PRN
  Administered 2020-04-25: 10 mg via INTRAVENOUS

## 2020-04-25 MED ORDER — CELECOXIB 200 MG PO CAPS
ORAL_CAPSULE | ORAL | Status: AC
  Filled 2020-04-25: qty 1

## 2020-04-25 MED ORDER — ONDANSETRON HCL 4 MG/2ML IJ SOLN
INTRAMUSCULAR | Status: DC | PRN
  Administered 2020-04-25: 4 mg via INTRAVENOUS

## 2020-04-25 MED ORDER — FLUORESCEIN SODIUM 10 % IV SOLN
INTRAVENOUS | Status: AC
  Filled 2020-04-25: qty 5

## 2020-04-25 MED ORDER — CEFAZOLIN SODIUM-DEXTROSE 2-4 GM/100ML-% IV SOLN
INTRAVENOUS | Status: AC
  Filled 2020-04-25: qty 100

## 2020-04-25 MED ORDER — MIDAZOLAM HCL 2 MG/2ML IJ SOLN
INTRAMUSCULAR | Status: AC
  Filled 2020-04-25: qty 2

## 2020-04-25 MED ORDER — ROCURONIUM BROMIDE 10 MG/ML (PF) SYRINGE
PREFILLED_SYRINGE | INTRAVENOUS | Status: DC | PRN
  Administered 2020-04-25: 80 mg via INTRAVENOUS

## 2020-04-25 MED ORDER — OXYCODONE HCL 5 MG PO TABS
5.0000 mg | ORAL_TABLET | ORAL | Status: DC | PRN
  Administered 2020-04-25 – 2020-04-26 (×6): 10 mg via ORAL

## 2020-04-25 MED ORDER — FENTANYL CITRATE (PF) 100 MCG/2ML IJ SOLN
INTRAMUSCULAR | Status: AC
  Filled 2020-04-25: qty 2

## 2020-04-25 MED ORDER — KETOROLAC TROMETHAMINE 30 MG/ML IJ SOLN
INTRAMUSCULAR | Status: AC
  Filled 2020-04-25: qty 1

## 2020-04-25 MED ORDER — FENTANYL CITRATE (PF) 100 MCG/2ML IJ SOLN
25.0000 ug | INTRAMUSCULAR | Status: DC | PRN
  Administered 2020-04-25 (×2): 50 ug via INTRAVENOUS

## 2020-04-25 MED ORDER — SCOPOLAMINE 1 MG/3DAYS TD PT72
MEDICATED_PATCH | TRANSDERMAL | Status: AC
  Filled 2020-04-25: qty 1

## 2020-04-25 MED ORDER — ONDANSETRON HCL 4 MG/2ML IJ SOLN
INTRAMUSCULAR | Status: AC
  Filled 2020-04-25: qty 2

## 2020-04-25 MED ORDER — OXYCODONE HCL 5 MG PO TABS: 5.0000 mg | ORAL_TABLET | Freq: Four times a day (QID) | ORAL | 0 refills | Status: AC | PRN

## 2020-04-25 MED ORDER — ACETAMINOPHEN 500 MG PO TABS
ORAL_TABLET | ORAL | Status: AC
  Filled 2020-04-25: qty 2

## 2020-04-25 MED ORDER — POLYETHYLENE GLYCOL 3350 17 G PO PACK: 17.0000 g | PACK | Freq: Every day | ORAL | Status: DC | PRN

## 2020-04-25 MED ORDER — ONDANSETRON HCL 4 MG/2ML IJ SOLN: 4.0000 mg | Freq: Four times a day (QID) | INTRAMUSCULAR | Status: DC | PRN

## 2020-04-25 MED ORDER — 0.9 % SODIUM CHLORIDE (POUR BTL) OPTIME
TOPICAL | Status: DC | PRN
  Administered 2020-04-25: 500 mL

## 2020-04-25 MED ORDER — LIDOCAINE 2% (20 MG/ML) 5 ML SYRINGE
INTRAMUSCULAR | Status: AC
  Filled 2020-04-25: qty 5

## 2020-04-25 MED ORDER — LACTATED RINGERS IV SOLN: INTRAVENOUS | Status: DC

## 2020-04-25 MED ORDER — LIDOCAINE 2% (20 MG/ML) 5 ML SYRINGE
INTRAMUSCULAR | Status: DC | PRN
  Administered 2020-04-25: 100 mg via INTRAVENOUS

## 2020-04-25 MED ORDER — FENTANYL CITRATE (PF) 100 MCG/2ML IJ SOLN
INTRAMUSCULAR | Status: DC | PRN
  Administered 2020-04-25 (×6): 50 ug via INTRAVENOUS

## 2020-04-25 MED ORDER — PROMETHAZINE HCL 25 MG/ML IJ SOLN: 6.2500 mg | INTRAMUSCULAR | Status: DC | PRN

## 2020-04-25 MED ORDER — ROCURONIUM BROMIDE 10 MG/ML (PF) SYRINGE
PREFILLED_SYRINGE | INTRAVENOUS | Status: AC
  Filled 2020-04-25: qty 10

## 2020-04-25 MED ORDER — MIDAZOLAM HCL 2 MG/2ML IJ SOLN
INTRAMUSCULAR | Status: DC | PRN
  Administered 2020-04-25: 2 mg via INTRAVENOUS

## 2020-04-25 MED ORDER — SODIUM CHLORIDE (PF) 0.9 % IJ SOLN
INTRAMUSCULAR | Status: DC | PRN
  Administered 2020-04-25: 100 mL

## 2020-04-25 MED ORDER — VASOPRESSIN 20 UNIT/ML IV SOLN
INTRAVENOUS | Status: DC | PRN
  Administered 2020-04-25: 20 [IU] via SUBCUTANEOUS

## 2020-04-25 MED ORDER — SIMETHICONE 80 MG PO CHEW: 80.0000 mg | CHEWABLE_TABLET | Freq: Four times a day (QID) | ORAL | Status: DC | PRN

## 2020-04-25 MED ORDER — DOCUSATE SODIUM 100 MG PO CAPS: 100.0000 mg | ORAL_CAPSULE | Freq: Two times a day (BID) | ORAL | 0 refills | Status: AC | PRN

## 2020-04-25 MED ORDER — ACETAMINOPHEN 500 MG PO TABS
1000.0000 mg | ORAL_TABLET | Freq: Once | ORAL | Status: AC
  Administered 2020-04-25: 1000 mg via ORAL

## 2020-04-25 MED ORDER — SCOPOLAMINE 1 MG/3DAYS TD PT72
1.0000 | MEDICATED_PATCH | TRANSDERMAL | Status: DC
  Administered 2020-04-25: 1.5 mg via TRANSDERMAL

## 2020-04-25 MED ORDER — SUGAMMADEX SODIUM 200 MG/2ML IV SOLN
INTRAVENOUS | Status: DC | PRN
  Administered 2020-04-25: 200 mg via INTRAVENOUS

## 2020-04-25 MED ORDER — DOCUSATE SODIUM 100 MG PO CAPS
100.0000 mg | ORAL_CAPSULE | Freq: Two times a day (BID) | ORAL | Status: DC
  Administered 2020-04-25 (×2): 100 mg via ORAL

## 2020-04-25 MED ORDER — PROPOFOL 10 MG/ML IV BOLUS
INTRAVENOUS | Status: DC | PRN
  Administered 2020-04-25: 200 mg via INTRAVENOUS

## 2020-04-25 MED ORDER — KETOROLAC TROMETHAMINE 15 MG/ML IJ SOLN: 15.0000 mg | INTRAMUSCULAR | Status: AC

## 2020-04-25 MED ORDER — IBUPROFEN 800 MG PO TABS: 800.0000 mg | ORAL_TABLET | Freq: Four times a day (QID) | ORAL | Status: DC

## 2020-04-25 MED ORDER — LISINOPRIL 10 MG PO TABS
10.0000 mg | ORAL_TABLET | Freq: Every day | ORAL | Status: DC
  Administered 2020-04-25: 10 mg via ORAL
  Filled 2020-04-25: qty 1

## 2020-04-25 MED ORDER — CELECOXIB 200 MG PO CAPS
200.0000 mg | ORAL_CAPSULE | Freq: Once | ORAL | Status: AC
  Administered 2020-04-25: 200 mg via ORAL

## 2020-04-25 MED ORDER — PANTOPRAZOLE SODIUM 40 MG PO TBEC: 40.0000 mg | DELAYED_RELEASE_TABLET | Freq: Every day | ORAL | Status: DC

## 2020-04-25 MED ORDER — ONDANSETRON HCL 4 MG PO TABS: 4.0000 mg | ORAL_TABLET | Freq: Four times a day (QID) | ORAL | Status: DC | PRN

## 2020-04-25 MED ORDER — MONTELUKAST SODIUM 10 MG PO TABS
10.0000 mg | ORAL_TABLET | Freq: Every day | ORAL | Status: DC
  Administered 2020-04-25: 10 mg via ORAL
  Filled 2020-04-25: qty 1

## 2020-04-25 MED ORDER — CEFAZOLIN SODIUM-DEXTROSE 2-4 GM/100ML-% IV SOLN
2.0000 g | INTRAVENOUS | Status: AC
  Administered 2020-04-25: 2 g via INTRAVENOUS

## 2020-04-25 SURGICAL SUPPLY — 26 items
BLADE SURG 15 STRL LF DISP TIS (BLADE) ×1 IMPLANT
BLADE SURG 15 STRL SS (BLADE) ×3
COVER WAND RF STERILE (DRAPES) ×3 IMPLANT
ELECT BLADE TIP CTD 4 INCH (ELECTRODE) ×3 IMPLANT
GAUZE PACKING 1/4 X5 YD (GAUZE/BANDAGES/DRESSINGS) ×3 IMPLANT
GLOVE BIO SURGEON STRL SZ 6 (GLOVE) ×3 IMPLANT
GLOVE BIOGEL PI IND STRL 6.5 (GLOVE) ×1 IMPLANT
GLOVE BIOGEL PI IND STRL 7.0 (GLOVE) ×2 IMPLANT
GLOVE BIOGEL PI INDICATOR 6.5 (GLOVE) ×2
GLOVE BIOGEL PI INDICATOR 7.0 (GLOVE) ×4
GOWN STRL REUS W/ TWL LRG LVL3 (GOWN DISPOSABLE) ×4 IMPLANT
GOWN STRL REUS W/TWL LRG LVL3 (GOWN DISPOSABLE) ×12
KIT TURNOVER CYSTO (KITS) ×3 IMPLANT
LIGASURE IMPACT 36 18CM CVD LR (INSTRUMENTS) IMPLANT
NS IRRIG 1000ML POUR BTL (IV SOLUTION) ×3 IMPLANT
PACK VAGINAL WOMENS (CUSTOM PROCEDURE TRAY) ×3 IMPLANT
PAD OB MATERNITY 4.3X12.25 (PERSONAL CARE ITEMS) ×3 IMPLANT
SET CYSTO W/LG BORE CLAMP LF (SET/KITS/TRAYS/PACK) ×3 IMPLANT
SHEARS FOC LG CVD HARMONIC 17C (MISCELLANEOUS) ×3 IMPLANT
SUT VIC AB 1 CT1 18XBRD ANBCTR (SUTURE) ×2 IMPLANT
SUT VIC AB 1 CT1 8-18 (SUTURE) ×6
SUT VIC AB 2-0 CT1 27 (SUTURE) ×3
SUT VIC AB 2-0 CT1 TAPERPNT 27 (SUTURE) ×1 IMPLANT
SUT VICRYL 0 TIES 12 18 (SUTURE) ×3 IMPLANT
TOWEL OR 17X26 10 PK STRL BLUE (TOWEL DISPOSABLE) ×6 IMPLANT
TRAY FOLEY W/BAG SLVR 14FR (SET/KITS/TRAYS/PACK) ×3 IMPLANT

## 2020-04-25 NOTE — Op Note (Addendum)
04/25/20  Surgeon: Ellison Hughs, MD Assist: Lavina Hamman, MD Procedure: Total vaginal hysterectomy; cystoscopy; right salpingectomy Indications: AUB-HMB, history of recurrent cervical dysplasia EBL: 600 cc IVF: 1700 cc UOP: 500 cc Specimens: Uterus (with Mirena IUD inside), cervix, right fallopian tube Findings:External genitalia within normal limits. On bimanual exam, anteverted mobile globular 8 cm uterus.  No palpable adnexal masses however limited by habitus. IUD strings not visible on exam, consistent with office evaluations.  Annette Cowan is a 44yo [phone] with chronic dysmenorrhea and AUB HMB currently with Mirena in place.  Still experiencing monthly pain when cycle would occur.  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  Satisfied fertility, desires surgical management of both issues via hysterectomy at this time. Of note, TVUS showed evidence of possible adenomyosis with heterogenous, unequal myometrium and shadowing plus globular fundus. IUD in place on imaging however strings not visible in office.   Consent: Risks of TVH include infection of the uterus, pelvic organs, or skin, inadvertent injury to internal organs, such as bowel or bladder. If there is major injury, extensive surgery may be required. If injury is minor, it may be treated with relative ease. Discussed possibility of excessive blood loss and transfusion. Patient aware that no future fertility will remain after procedure. Patient accepts the possibility of blood transfusion, if necessary. Bowel and/or bladder injury may require prolonged inpatient stay and possible colostomy, Foley catheter, etc, as deemed fit by other surgeon. Patient understands and agrees to move forward with surgery.   Operative technique: Patient was taken to the operating room where she was placed in dorsolithotomy position with adequate padding of all pressure points.  General anesthesia was established  without issue.  Preoperative antibiotics were given per guidelines (2g Ancef).  She was prepped and draped in normal sterile fashion  A weighted speculum was placed in the posterior fornix with Deaver displacing bladder anteriorly after a Foley catheter was placed.  Using a spinal needle, 10 cc of 20 units of Pitressin and 100 mL normal saline was used to hydrodissect the cervicovaginal junction. A semilunar incision was made in the mucosa of the vaginal fornix below the attachment of the bladder using Bovie cautery.  The remainder of the bladder was freed from the anterior surface of the uterus by subsequent sharp and blunt dissection at the plica vesicouterine.  After the loose areolar plane was entered, the fascial dissection was completed.  The cervix was then pulled forward and a semilunar incision was made and joined on each of the sides of the cervix and connected to the height of the posterior fornix. The anterior and posterior incisions were then joined on each side of the cervix.  Care was taken to avoid injury to the rectum.  After cutting the mucosa, the uterosacral ligaments were then identified and clamped using Heaney clamps.  They were then cut and transfixed using 0 Vicryl sutures.  Bilateral ligaments were tagged with hemostats.  The bases of the cardinal ligaments were then clamped and cut using Heaney clamps and similarly transfixed.  Similar clamp-cut-suture technique was used until bilateral cornua were reached.  Throughout process, Hologic instrument used to give additional hemostasis during procedure given concern for possible adenomyosis on preoperative imaging.  Zeppelin clamp was placed across left adnexa which was then Heaney sutured and transfixed, flashing to ensure hemostasis.  This process was repeated on the right adnexa.  The uterus was noted to be adequately mobile and delivered easily through the vagina.  All specimens were then passed off to pathology.  At this time, bleeding  noted from bilateral adnexal pedicles.  Controlled on left pedicle with fixation stitch of 0 Vicryl.  In right pedicle, right fallopian tube from brain noted to be bleeding.  Right salpingectomy carried out using Kelly clamp and suture ligation.  Excellent hemostasis was noted at this point.  Uterosacral ligament tags were tied together for increased apical support.  The vaginal cuff was then closed vertically using 2-0 Vicryl suture in a running locked fashion.  Good hemostasis was confirmed.  Vaginal packing was placed at end of procedure.  Cystoscopy was then carried out.  Fluorescein was administered by anesthesia.  The Foley catheter was then removed and 30 degree cystoscope inserted into the urethra.  Upon insertion of scope, positive bubble sign.  No evidence of suture or gross injury or bleeding.  Bilateral efflux of yellow-green tinted urine was noted from both left and right ureteral orifices.  Total fluid instilled into bladder was 200 cc.  The Foley was then replaced at the end of cystoscopy.  The patient was then awoken and taken to PACU in stable position.  All counts were correct x2 at end of procedure.

## 2020-04-25 NOTE — Anesthesia Postprocedure Evaluation (Signed)
Anesthesia Post Note  Patient: Annette Cowan  Procedure(s) Performed: HYSTERECTOMY VAGINAL (N/A ) CYSTOSCOPY (N/A )     Patient location during evaluation: PACU Anesthesia Type: General Level of consciousness: sedated Pain management: pain level controlled Vital Signs Assessment: post-procedure vital signs reviewed and stable Respiratory status: spontaneous breathing and respiratory function stable Cardiovascular status: stable Postop Assessment: no apparent nausea or vomiting Anesthetic complications: no   No complications documented.  Last Vitals:  Vitals:   04/25/20 0938 04/25/20 0945  BP:  140/70  Pulse:  (!) 101  Resp:  15  Temp: 36.8 C 36.8 C  SpO2:  98%    Last Pain:  Vitals:   04/25/20 1000  PainSc: 6                  Iosefa Weintraub DANIEL

## 2020-04-25 NOTE — Anesthesia Procedure Notes (Signed)
Procedure Name: Intubation Date/Time: 04/25/2020 7:34 AM Performed by: Pearson Grippe, CRNA Pre-anesthesia Checklist: Patient identified, Emergency Drugs available, Suction available and Patient being monitored Patient Re-evaluated:Patient Re-evaluated prior to induction Oxygen Delivery Method: Circle system utilized Preoxygenation: Pre-oxygenation with 100% oxygen Induction Type: IV induction Ventilation: Mask ventilation without difficulty Laryngoscope Size: Miller and 2 Grade View: Grade I Tube type: Oral Tube size: 7.0 mm Number of attempts: 1 Airway Equipment and Method: Stylet and Oral airway Placement Confirmation: ETT inserted through vocal cords under direct vision,  positive ETCO2 and breath sounds checked- equal and bilateral Secured at: 21 cm Tube secured with: Tape Dental Injury: Teeth and Oropharynx as per pre-operative assessment

## 2020-04-25 NOTE — Brief Op Note (Signed)
04/25/2020  9:30 AM  PATIENT:  Annette Cowan  44 y.o. female  PRE-OPERATIVE DIAGNOSIS:  abnormal uterine bleeding, pelvic pain  POST-OPERATIVE DIAGNOSIS:  abnormal uterine bleeding, pelvic pain  PROCEDURE:  Procedure(s): HYSTERECTOMY VAGINAL (N/A) CYSTOSCOPY (N/A)  Right salpingectomy  SURGEON:  Surgeon(s) and Role:    * Joury Allcorn, Valerie Roys, MD - Primary    * Meisinger, Todd, MD - Assisting  ANESTHESIA:   general  EBL:  600 mL   BLOOD ADMINISTERED:none  DRAINS: none   LOCAL MEDICATIONS USED: 10cc 20u pitressin in saline  SPECIMEN:  Uterus, cervix, right fallopian tube  DISPOSITION OF SPECIMEN:  PATHOLOGY  COUNTS:  YES  TOURNIQUET:  DICTATION: .Note written in EPIC  PLAN OF CARE: Admit for overnight observation  PATIENT DISPOSITION:  PACU - hemodynamically stable.   Delay start of Pharmacological VTE agent (>24hrs) due to surgical blood loss or risk of bleeding: yes

## 2020-04-25 NOTE — Transfer of Care (Signed)
Immediate Anesthesia Transfer of Care Note  Patient: Annette Cowan  Procedure(s) Performed: HYSTERECTOMY VAGINAL (N/A ) CYSTOSCOPY (N/A )  Patient Location: PACU  Anesthesia Type:General  Level of Consciousness: awake, alert  and oriented  Airway & Oxygen Therapy: Patient Spontanous Breathing and Patient connected to face mask oxygen  Post-op Assessment: Report given to RN and Post -op Vital signs reviewed and stable  Post vital signs: Reviewed and stable  Last Vitals:  Vitals Value Taken Time  BP 143/74   Temp    Pulse 92 04/25/20 0938  Resp 19 04/25/20 0938  SpO2 100 % 04/25/20 0938  Vitals shown include unvalidated device data.  Last Pain:  Vitals:   04/25/20 0657  PainSc: 0-No pain      Patients Stated Pain Goal: 4 (04/25/20 0657)  Complications: No complications documented.

## 2020-04-25 NOTE — H&P (Signed)
Annette Cowan is an 44 y.o. female. Presenting for scheduled surgery. No complaints this morning.   PMHx s/f h/o abnl pap with colposcopy (CIN1), AUB-HMB on Mirena, asthma, HTN, depression  Pertinent Gynecological History: Menses: flow is light and 2/2 Mirena Bleeding: see above Contraception: IUD DES exposure: denies Blood transfusions: none Sexually transmitted diseases: HPV Previous GYN Procedures: none  Last mammogram: normal Date: 12/2019 Last pap: abnormal: LISIL/+HRHPV Date: 01/05/2020 OB History: G3, P3003   Menstrual History: Menarche age: 47 No LMP recorded. (Menstrual status: IUD).    Past Medical History:  Diagnosis Date  . Anxiety   . Asthma   . Depression   . High blood pressure   . Migraine   . Preeclampsia in postpartum period     Past Surgical History:  Procedure Laterality Date  . KIDNEY STONE SURGERY      Family History  Problem Relation Age of Onset  . High blood pressure Mother   . Colon cancer Maternal Grandmother   . Heart disease Maternal Grandfather   . Diabetes Maternal Grandfather   . Asthma Daughter   . Asthma Son     Social History:  reports that she has never smoked. She has never used smokeless tobacco. She reports that she does not drink alcohol and does not use drugs.  Allergies: No Known Allergies  Medications Prior to Admission  Medication Sig Dispense Refill Last Dose  . diazepam (VALIUM) 5 MG tablet Take 5 mg by mouth every 12 (twelve) hours as needed for anxiety.   04/25/2020 at 0430  . fluticasone (FLONASE) 50 MCG/ACT nasal spray Place 2 sprays into both nostrils daily.    04/24/2020 at Unknown time  . Fluticasone Furoate 100 MCG/ACT AEPB INHALE 1 PUFF INTO THE LUNGS EVERY DAY (Patient taking differently: Inhale 1 puff into the lungs daily. ) 30 each 0 04/24/2020 at Unknown time  . furosemide (LASIX) 20 MG tablet Take 20 mg by mouth 2 (two) times daily.   04/24/2020 at Unknown time  . levonorgestrel (MIRENA, 52 MG,) 20  MCG/24HR IUD 1 each by Intrauterine route once.    04/25/2020  . lisinopril (PRINIVIL,ZESTRIL) 10 MG tablet Take 10 mg by mouth daily.    04/24/2020 at Unknown time  . loratadine (CLARITIN) 10 MG tablet Take 10 mg by mouth daily.   04/25/2020 at 0430  . montelukast (SINGULAIR) 10 MG tablet Take 10 mg by mouth at bedtime.    04/24/2020 at Unknown time  . omeprazole (PRILOSEC) 40 MG capsule TAKE 1 CAPSULE BY MOUTH EVERY DAY (Patient taking differently: Take 40 mg by mouth daily. ) 30 capsule 0 04/25/2020 at 0430  . sertraline (ZOLOFT) 100 MG tablet Take 100 mg by mouth daily.    04/25/2020 at 0430time  . albuterol (PROAIR HFA) 108 (90 Base) MCG/ACT inhaler Inhale 2 puffs into the lungs every 6 (six) hours as needed for wheezing or shortness of breath.    Unknown at Unknown time  . meloxicam (MOBIC) 15 MG tablet Take 1 tablet (15 mg total) by mouth daily. (Patient not taking: Reported on 04/10/2020) 30 tablet 2 Not Taking at Unknown time  . methylPREDNISolone (MEDROL DOSEPAK) 4 MG TBPK tablet 6 day dose pack - take as directed (Patient not taking: Reported on 04/10/2020) 21 tablet 0 Not Taking at Unknown time  . Multiple Vitamins-Minerals (MULTIVITAMIN ADULT PO) Take 1 tablet by mouth daily.    More than a month at Unknown time    Review of Systems  Constitutional: Negative  for chills and fever.  Respiratory: Negative for shortness of breath.   Cardiovascular: Negative for chest pain, palpitations and leg swelling.  Gastrointestinal: Negative for abdominal pain, nausea and vomiting.  Neurological: Negative for dizziness, weakness and headaches.  Psychiatric/Behavioral: Negative for suicidal ideas.    Blood pressure 128/74, temperature 98.4 F (36.9 C), resp. rate 16, height 5' (1.524 m), weight 88.8 kg, SpO2 97 %. Physical Exam Gen: NAD CV; STAB, RRR Abd: Soft, NTTP, BSx4 MSK: neg calf edema, no TTP Psych: appropriate Results for orders placed or performed during the hospital encounter of 04/25/20  (from the past 24 hour(s))  Pregnancy, urine POC     Status: None   Collection Time: 04/25/20  5:48 AM  Result Value Ref Range   Preg Test, Ur NEGATIVE NEGATIVE    No results found.  Assessment/Plan: This is a 44yo [id number] presenting for scheduled TVH/cysto for AUB-HMB and h/o cervical dysplasia with satisfied fertility Patient with cervical dysplasia and satisfied fertility. Pelvis proven to 8lb7oz (dystocia with 8lb10oz) Risks of TVH include infection of the uterus, pelvic organs, or skin, inadvertent injury to internal organs, such as bowel or bladder. If there is major injury, extensive surgery may be required. If injury is minor, it may be treated with relative ease. Discussed possibility of excessive blood loss and transfusion. Patient aware that no future fertility will remain after procedure. Patient accepts the possibility of blood transfusion, if necessary. Bowel and/or bladder injury may require prolonged inpatient stay and possible colostomy, Foley catheter, etc, as deemed fit by other surgeon. Patient understands and agrees to move forward with surgery.   Annette Cowan 04/25/2020, 7:21 AM

## 2020-04-26 ENCOUNTER — Encounter (HOSPITAL_BASED_OUTPATIENT_CLINIC_OR_DEPARTMENT_OTHER): Payer: Self-pay | Admitting: Obstetrics and Gynecology

## 2020-04-26 DIAGNOSIS — N939 Abnormal uterine and vaginal bleeding, unspecified: Secondary | ICD-10-CM | POA: Diagnosis not present

## 2020-04-26 LAB — CBC
HCT: 35.3 % — ABNORMAL LOW (ref 36.0–46.0)
Hemoglobin: 11.7 g/dL — ABNORMAL LOW (ref 12.0–15.0)
MCH: 29.5 pg (ref 26.0–34.0)
MCHC: 33.1 g/dL (ref 30.0–36.0)
MCV: 88.9 fL (ref 80.0–100.0)
Platelets: 233 10*3/uL (ref 150–400)
RBC: 3.97 MIL/uL (ref 3.87–5.11)
RDW: 12.8 % (ref 11.5–15.5)
WBC: 10.9 10*3/uL — ABNORMAL HIGH (ref 4.0–10.5)
nRBC: 0 % (ref 0.0–0.2)

## 2020-04-26 LAB — SURGICAL PATHOLOGY

## 2020-04-26 MED ORDER — KETOROLAC TROMETHAMINE 30 MG/ML IJ SOLN
INTRAMUSCULAR | Status: AC
  Filled 2020-04-26: qty 1

## 2020-04-26 MED ORDER — OXYCODONE HCL 5 MG PO TABS
ORAL_TABLET | ORAL | Status: AC
  Filled 2020-04-26: qty 2

## 2020-04-26 NOTE — Discharge Instructions (Signed)
Vaginal Hysterectomy, Care After Refer to this sheet in the next few weeks. These instructions provide you with information about caring for yourself after your procedure. Your health care provider may also give you more specific instructions. Your treatment has been planned according to current medical practices, but problems sometimes occur. Call your health care provider if you have any problems or questions after your procedure. What can I expect after the procedure? After the procedure, it is common to have:  Pain.  Soreness and numbness in your incision areas.  Vaginal bleeding and discharge.  Constipation.  Temporary problems emptying the bladder.  Feelings of sadness or other emotions. Follow these instructions at home: Medicines  Take over-the-counter and prescription medicines only as told by your health care provider.  If you were prescribed an antibiotic medicine, take it as told by your health care provider. Do not stop taking the antibiotic even if you start to feel better.  Do not drive or operate heavy machinery while taking prescription pain medicine. Activity  Return to your normal activities as told by your health care provider. Ask your health care provider what activities are safe for you.  Get regular exercise as told by your health care provider. You may be told to take short walks every day and go farther each time.  Do not lift anything that is heavier than 10 lb (4.5 kg). General instructions   Do not put anything in your vagina for 6 weeks after your surgery or as told by your health care provider. This includes tampons and douches.  Do not have sex until your health care provider says you can.  Do not take baths, swim, or use a hot tub until your health care provider approves.  Drink enough fluid to keep your urine clear or pale yellow.  Do not drive for 24 hours if you were given a sedative.  Keep all follow-up visits as told by your health  care provider. This is important. Contact a health care provider if:  Your pain medicine is not helping.  You have a fever.  You have redness, swelling, or pain at your incision site.  You have blood, pus, or a bad-smelling discharge from your vagina.  You continue to have difficulty urinating. Get help right away if:  You have severe abdominal or back pain.  You have heavy bleeding from your vagina.  You have chest pain or shortness of breath. This information is not intended to replace advice given to you by your health care provider. Make sure you discuss any questions you have with your health care provider. Document Revised: 06/25/2016 Document Reviewed: 11/17/2015 Elsevier Patient Education  2020 [employer]  

## 2020-04-26 NOTE — Discharge Summary (Signed)
Physician Discharge Summary  Patient ID: Annette Cowan MRN: [id number] DOB/AGE: [id number] y.o.  Admit date: 04/25/2020 Discharge date: 04/26/2020  Admission Diagnoses:  Discharge Diagnoses:  Active Problems:   Abnormal uterine bleeding (AUB)   Discharged Condition: good  Hospital Course: Patient presented for scheduled TVH/cysto for abrnomal uterine bleeding plus /o recurrent cervical dysplasia. Uncomplicated procedure, please see operative note for full details (included right salpingectomy) By POD#1, ambulating, tolerating PO, voiding, pain controlled on PO meds. Discharged home in stable fashion with routine precautions  Consults: None   Discharge Exam: Blood pressure 130/80, pulse 82, temperature 98.2 F (36.8 C), resp. rate 18, height 5' (1.524 m), weight 88.8 kg, SpO2 98 %. General: alert, cooperative and no distress Lochia:normal flow Chest: CTAB Heart: RRR no m/r/g Abdomen: +BS, soft, nontender GU: Packing removed w/ evidence of old scant bleeding, cuff palpates intact  Extremities: neg edema, neg calf TTP BL, neg Homans BL  Disposition: Discharge disposition: 01-Home or Self Care       Discharge Instructions    Call MD for:  difficulty breathing, headache or visual disturbances   Complete by: As directed    Call MD for:  extreme fatigue   Complete by: As directed    Call MD for:  hives   Complete by: As directed    Call MD for:  persistant dizziness or light-headedness   Complete by: As directed    Call MD for:  persistant nausea and vomiting   Complete by: As directed    Call MD for:  redness, tenderness, or signs of infection (pain, swelling, redness, odor or green/yellow discharge around incision site)   Complete by: As directed    Call MD for:  severe uncontrolled pain   Complete by: As directed    Call MD for:  temperature >100.4   Complete by: As directed    Diet - low sodium heart healthy   Complete by: As directed    Increase  activity slowly   Complete by: As directed    Lifting restrictions   Complete by: As directed    Nothing >15lbs x6wks   Sexual Activity Restrictions   Complete by: As directed    None for 6wks     Allergies as of 04/26/2020   No Known Allergies     Medication List    STOP taking these medications   meloxicam 15 MG tablet Commonly known as: MOBIC   methylPREDNISolone 4 MG Tbpk tablet Commonly known as: MEDROL DOSEPAK   Mirena (52 MG) 20 MCG/24HR IUD Generic drug: levonorgestrel     TAKE these medications   diazepam 5 MG tablet Commonly known as: VALIUM Take 5 mg by mouth every 12 (twelve) hours as needed for anxiety.   docusate sodium 100 MG capsule Commonly known as: COLACE Take 1 capsule (100 mg total) by mouth 2 (two) times daily as needed for mild constipation.   fluticasone 50 MCG/ACT nasal spray Commonly known as: FLONASE Place 2 sprays into both nostrils daily.   Fluticasone Furoate 100 MCG/ACT Aepb INHALE 1 PUFF INTO THE LUNGS EVERY DAY What changed: See the new instructions.   furosemide 20 MG tablet Commonly known as: LASIX Take 20 mg by mouth 2 (two) times daily.   ibuprofen 800 MG tablet Commonly known as: ADVIL Take 1 tablet (800 mg total) by mouth every 8 (eight) hours as needed.   lisinopril 10 MG tablet Commonly known as: ZESTRIL Take 10 mg by mouth daily.   loratadine 10  MG tablet Commonly known as: CLARITIN Take 10 mg by mouth daily.   montelukast 10 MG tablet Commonly known as: SINGULAIR Take 10 mg by mouth at bedtime.   MULTIVITAMIN ADULT PO Take 1 tablet by mouth daily.   omeprazole 40 MG capsule Commonly known as: PRILOSEC TAKE 1 CAPSULE BY MOUTH EVERY DAY What changed: how much to take   oxyCODONE 5 MG immediate release tablet Commonly known as: Oxy IR/ROXICODONE Take 1 tablet (5 mg total) by mouth every 6 (six) hours as needed for severe pain.   ProAir HFA 108 (90 Base) MCG/ACT inhaler Generic drug: albuterol Inhale 2  puffs into the lungs every 6 (six) hours as needed for wheezing or shortness of breath.   sertraline 100 MG tablet Commonly known as: ZOLOFT Take 100 mg by mouth daily.       Follow-up Information    Nobie Alleyne, Melida Quitter, MD Follow up.   Specialty: Obstetrics and Gynecology Why: Kaylyn Layer and 6wks appointment Contact information: [address] [place] [address] [phone]               Signed: Deliah Boston 04/26/2020, 7:32 AM

## 2020-04-26 NOTE — Progress Notes (Signed)
POD #1  Subjective:  No acute events overnight.  Pt denies problems with ambulating, voiding or po intake.  She denies nausea or vomiting.  Pain is well controlled.  She has had flatus. She has not had bowel movement. No VB or discharge noted Objective: Blood pressure 130/80, pulse 82, temperature 98.2 F (36.8 C), resp. rate 18, height 5' (1.524 m), weight 88.8 kg, SpO2 98 %.  Physical Exam:  General: alert, cooperative and no distress Lochia:normal flow Chest: CTAB Heart: RRR no m/r/g Abdomen: +BS, soft, nontender GU: Packing removed w/ evidence of old scant bleeding, cuff palpates intact  Extremities: neg edema, neg calf TTP BL, neg Homans BL  Recent Labs    04/26/20 0451  HGB 11.7*  HCT 35.3*    Assessment/Plan:  ASSESSMENT: SAIYA CRIST is a 44 y.o. G3P3003s/p TVH/RS/cysto for AUB-HMB/h/o recurrent cervical dysplasia. PMHx s/f asthma, HTN, depression.   -Continue PO pain meds -voiding, ambulating -D/C home today   LOS: 0 days

## 2020-07-01 ENCOUNTER — Other Ambulatory Visit: Payer: Self-pay | Admitting: Orthopedic Surgery

## 2020-07-01 DIAGNOSIS — M545 Low back pain, unspecified: Secondary | ICD-10-CM

## 2020-07-01 DIAGNOSIS — M533 Sacrococcygeal disorders, not elsewhere classified: Secondary | ICD-10-CM

## 2020-07-01 DIAGNOSIS — G8929 Other chronic pain: Secondary | ICD-10-CM

## 2020-07-15 ENCOUNTER — Other Ambulatory Visit: Payer: Self-pay

## 2020-07-15 ENCOUNTER — Ambulatory Visit
Admission: RE | Admit: 2020-07-15 | Discharge: 2020-07-15 | Disposition: A | Discharge status: Home / Self Care | Payer: Self-pay | Source: Ambulatory Visit | Attending: Orthopedic Surgery | Admitting: Orthopedic Surgery

## 2020-07-15 ENCOUNTER — Ambulatory Visit
Admission: RE | Admit: 2020-07-15 | Discharge: 2020-07-15 | Disposition: A | Discharge status: Home / Self Care | Payer: Worker's Compensation | Source: Ambulatory Visit | Attending: Orthopedic Surgery | Admitting: Orthopedic Surgery

## 2020-07-15 DIAGNOSIS — M545 Low back pain, unspecified: Secondary | ICD-10-CM

## 2020-07-15 DIAGNOSIS — M533 Sacrococcygeal disorders, not elsewhere classified: Secondary | ICD-10-CM

## 2020-07-30 ENCOUNTER — Other Ambulatory Visit: Payer: Self-pay | Admitting: Orthopedic Surgery

## 2020-07-30 DIAGNOSIS — M533 Sacrococcygeal disorders, not elsewhere classified: Secondary | ICD-10-CM

## 2020-08-12 ENCOUNTER — Other Ambulatory Visit: Payer: Self-pay

## 2020-08-12 ENCOUNTER — Inpatient Hospital Stay: Admission: RE | Admit: 2020-08-12 | Payer: Self-pay | Source: Ambulatory Visit

## 2020-08-23 ENCOUNTER — Other Ambulatory Visit: Payer: Self-pay

## 2020-09-02 ENCOUNTER — Ambulatory Visit
Admission: RE | Admit: 2020-09-02 | Discharge: 2020-09-02 | Disposition: A | Discharge status: Home / Self Care | Payer: Worker's Compensation | Source: Ambulatory Visit | Attending: Orthopedic Surgery | Admitting: Orthopedic Surgery

## 2020-09-02 ENCOUNTER — Other Ambulatory Visit: Payer: Self-pay

## 2020-09-02 DIAGNOSIS — M533 Sacrococcygeal disorders, not elsewhere classified: Secondary | ICD-10-CM

## 2020-09-02 MED ORDER — METHYLPREDNISOLONE ACETATE 40 MG/ML INJ SUSP (RADIOLOG: 120.0000 mg | Freq: Once | INTRAMUSCULAR | Status: DC

## 2021-10-17 ENCOUNTER — Encounter: Payer: Self-pay | Admitting: Emergency Medicine

## 2021-10-17 ENCOUNTER — Ambulatory Visit
Admission: EM | Admit: 2021-10-17 | Discharge: 2021-10-17 | Disposition: A | Discharge status: Home / Self Care | Payer: BC Managed Care – PPO | Attending: Family Medicine | Admitting: Family Medicine

## 2021-10-17 DIAGNOSIS — H66002 Acute suppurative otitis media without spontaneous rupture of ear drum, left ear: Secondary | ICD-10-CM | POA: Diagnosis not present

## 2021-10-17 MED ORDER — KETOROLAC TROMETHAMINE 60 MG/2ML IM SOLN
60.0000 mg | Freq: Once | INTRAMUSCULAR | Status: AC
  Administered 2021-10-17: 60 mg via INTRAMUSCULAR

## 2021-10-17 MED ORDER — AMOXICILLIN 875 MG PO TABS
875.0000 mg | ORAL_TABLET | Freq: Two times a day (BID) | ORAL | 0 refills | Status: DC
Start: 2021-10-17 — End: 2021-10-17

## 2021-10-17 MED ORDER — AMOXICILLIN 875 MG PO TABS
875.0000 mg | ORAL_TABLET | Freq: Two times a day (BID) | ORAL | 0 refills | Status: DC
Start: 2021-10-17 — End: 2024-04-29

## 2021-10-17 NOTE — ED Provider Notes (Signed)
[facility]    CSN: [id number] Arrival date & time: 10/17/21  1833      History   Chief Complaint No chief complaint on file.   HPI Annette Cowan is a 45 y.o. female.   Patient presenting today with sudden onset significant sharp stabbing constant left ear pain, muffled hearing.  Denies fever, chills, drainage from the ear, severe headache.  Has been congested off and on which she thinks is from allergies for quite some time now prior to onset of this symptom.  So far not tried anything over-the-counter for symptoms.   Past Medical History:  Diagnosis Date   Anxiety    Asthma    Depression    High blood pressure    Migraine    Preeclampsia in postpartum period     Patient Active Problem List   Diagnosis Date Noted   Abnormal uterine bleeding (AUB) 04/25/2020    Past Surgical History:  Procedure Laterality Date   CYSTOSCOPY N/A 04/25/2020   Procedure: CYSTOSCOPY;  Surgeon: Carlisle Cater, MD;  Location: [facility];  Service: Gynecology;  Laterality: N/A;   KIDNEY STONE SURGERY     VAGINAL HYSTERECTOMY N/A 04/25/2020   Procedure: HYSTERECTOMY VAGINAL;  Surgeon: Carlisle Cater, MD;  Location: [facility];  Service: Gynecology;  Laterality: N/A;    OB History   No obstetric history on file.      Home Medications    Prior to Admission medications   Medication Sig Start Date End Date Taking? Authorizing Provider  albuterol (PROAIR HFA) 108 (90 Base) MCG/ACT inhaler Inhale 2 puffs into the lungs every 6 (six) hours as needed for wheezing or shortness of breath.     [provider]  amoxicillin (AMOXIL) 875 MG tablet Take 1 tablet (875 mg total) by mouth 2 (two) times daily. 10/17/21   Particia Nearing, PA-C  diazepam (VALIUM) 5 MG tablet Take 5 mg by mouth every 12 (twelve) hours as needed for anxiety.    [provider]  docusate sodium (COLACE) 100 MG capsule Take 1 capsule (100 mg total)  by mouth 2 (two) times daily as needed for mild constipation. 04/25/20   Shivaji, Valerie Roys, MD  fluticasone (FLONASE) 50 MCG/ACT nasal spray Place 2 sprays into both nostrils daily.  01/04/17   [provider]  Fluticasone Furoate 100 MCG/ACT AEPB INHALE 1 PUFF INTO THE LUNGS EVERY DAY Patient taking differently: Inhale 1 puff into the lungs daily.  12/06/17   Kozlow, Alvira Philips, MD  furosemide (LASIX) 20 MG tablet Take 20 mg by mouth 2 (two) times daily.    [provider]  ibuprofen (ADVIL) 800 MG tablet Take 1 tablet (800 mg total) by mouth every 8 (eight) hours as needed. 04/25/20   Shivaji, Valerie Roys, MD  lisinopril (PRINIVIL,ZESTRIL) 10 MG tablet Take 10 mg by mouth daily.  01/08/17   [provider]  loratadine (CLARITIN) 10 MG tablet Take 10 mg by mouth daily.    [provider]  montelukast (SINGULAIR) 10 MG tablet Take 10 mg by mouth at bedtime.  01/08/17   [provider]  Multiple Vitamins-Minerals (MULTIVITAMIN ADULT PO) Take 1 tablet by mouth daily.     [provider]  omeprazole (PRILOSEC) 40 MG capsule TAKE 1 CAPSULE BY MOUTH EVERY DAY Patient taking differently: Take 40 mg by mouth daily.  11/17/17   Kozlow, Alvira Philips, MD  oxyCODONE (OXY IR/ROXICODONE) 5 MG immediate release tablet Take  1 tablet (5 mg total) by mouth every 6 (six) hours as needed for severe pain. 04/25/20   Shivaji, Valerie Roys, MD  sertraline (ZOLOFT) 100 MG tablet Take 100 mg by mouth daily.  01/08/17   [provider]    Family History Family History  Problem Relation Age of Onset   High blood pressure Mother    Colon cancer Maternal Grandmother    Heart disease Maternal Grandfather    Diabetes Maternal Grandfather    Asthma Daughter    Asthma Son     Social History Social History   Tobacco Use   Smoking status: Never   Smokeless tobacco: Never  Vaping Use   Vaping Use: Never used  Substance Use Topics   Alcohol use: No   Drug use: No      Allergies   Patient has no known allergies.   Review of Systems Review of Systems Per HPI  Physical Exam Triage Vital Signs ED Triage Vitals  Enc Vitals Group     BP      Pulse      Resp      Temp      Temp src      SpO2      Weight      Height      Head Circumference      Peak Flow      Pain Score      Pain Loc      Pain Edu?      Excl. in GC?    No data found.  Updated Vital Signs BP (!) 168/101 (BP Location: Right Arm)   Pulse 80   Temp 98 F (36.7 C) (Oral)   Resp 18   SpO2 96%   Visual Acuity Right Eye Distance:   Left Eye Distance:   Bilateral Distance:    Right Eye Near:   Left Eye Near:    Bilateral Near:     Physical Exam Vitals and nursing note reviewed.  Constitutional:      Appearance: Normal appearance. She is not ill-appearing.  HENT:     Head: Atraumatic.     Right Ear: Tympanic membrane normal.     Ears:     Comments: Left TM significantly erythematous, edematous. Eyes:     Extraocular Movements: Extraocular movements intact.     Conjunctiva/sclera: Conjunctivae normal.  Cardiovascular:     Rate and Rhythm: Normal rate and regular rhythm.     Heart sounds: Normal heart sounds.  Pulmonary:     Effort: Pulmonary effort is normal.     Breath sounds: Normal breath sounds.  Musculoskeletal:        General: Normal range of motion.     Cervical back: Normal range of motion and neck supple.  Skin:    General: Skin is warm and dry.  Neurological:     Mental Status: She is alert and oriented to person, place, and time.  Psychiatric:        Mood and Affect: Mood normal.        Thought Content: Thought content normal.        Judgment: Judgment normal.     UC Treatments / Results  Labs (all labs ordered are listed, but only abnormal results are displayed) Labs Reviewed - No data to display  EKG   Radiology No results found.  Procedures Procedures (including critical care time)  Medications Ordered in  UC Medications  ketorolac (TORADOL) injection 60 mg (60 mg Intramuscular  Given 10/17/21 2012)    Initial Impression / Assessment and Plan / UC Course  I have reviewed the triage vital signs and the nursing notes.  Pertinent labs & imaging results that were available during my care of the patient were reviewed by me and considered in my medical decision making (see chart for details).     Hypertensive in triage, otherwise vital signs reassuring.  Suspect hypertension secondary to significant pain.  We will treat with amoxicillin, IM Toradol for pain control.  Return for acutely worsening symptoms.  Final Clinical Impressions(s) / UC Diagnoses   Final diagnoses:  Non-recurrent acute suppurative otitis media of left ear without spontaneous rupture of tympanic membrane   Discharge Instructions   None    ED Prescriptions     Medication Sig Dispense Auth. Provider   amoxicillin (AMOXIL) 875 MG tablet  (Status: Discontinued) Take 1 tablet (875 mg total) by mouth 2 (two) times daily. 20 tablet Particia Nearing, [place]   amoxicillin (AMOXIL) 875 MG tablet Take 1 tablet (875 mg total) by mouth 2 (two) times daily. 20 tablet Particia Nearing, [place]      PDMP not reviewed this encounter.   Particia Nearing, [place] 10/17/21 2012

## 2023-10-07 ENCOUNTER — Other Ambulatory Visit: Payer: Self-pay | Admitting: Obstetrics and Gynecology

## 2023-10-07 DIAGNOSIS — R928 Other abnormal and inconclusive findings on diagnostic imaging of breast: Secondary | ICD-10-CM

## 2023-10-25 ENCOUNTER — Ambulatory Visit
Admission: EM | Admit: 2023-10-25 | Discharge: 2023-10-25 | Disposition: A | Discharge status: Home / Self Care | Payer: BC Managed Care – PPO | Attending: Family Medicine | Admitting: Family Medicine

## 2023-10-25 DIAGNOSIS — R21 Rash and other nonspecific skin eruption: Secondary | ICD-10-CM | POA: Diagnosis not present

## 2023-10-25 MED ORDER — TRIAMCINOLONE ACETONIDE 0.1 % EX CREA: 1.0000 | TOPICAL_CREAM | Freq: Two times a day (BID) | CUTANEOUS | 0 refills | Status: AC | PRN

## 2023-10-25 NOTE — ED Provider Notes (Signed)
[facility]    CSN: [id number] Arrival date & time: 10/25/23  0820      History   Chief Complaint No chief complaint on file.   HPI Annette Cowan is a 47 y.o. female.   Patient presenting today with 1 day history of an itchy and painful rash to bottoms of feet and palms of hands.  Denies fever, upper respiratory symptoms, oral pain, new products or foods or medications.  So far not tried anything over-the-counter for symptoms.  Works at a school, numerous sick contacts.    Past Medical History:  Diagnosis Date   Anxiety    Asthma    Depression    High blood pressure    Migraine    Preeclampsia in postpartum period     Patient Active Problem List   Diagnosis Date Noted   Abnormal uterine bleeding (AUB) 04/25/2020    Past Surgical History:  Procedure Laterality Date   CYSTOSCOPY N/A 04/25/2020   Procedure: CYSTOSCOPY;  Surgeon: Carlisle Cater, MD;  Location: [facility];  Service: Gynecology;  Laterality: N/A;   KIDNEY STONE SURGERY     VAGINAL HYSTERECTOMY N/A 04/25/2020   Procedure: HYSTERECTOMY VAGINAL;  Surgeon: Carlisle Cater, MD;  Location: [facility];  Service: Gynecology;  Laterality: N/A;    OB History   No obstetric history on file.      Home Medications    Prior to Admission medications   Medication Sig Start Date End Date Taking? Authorizing Provider  triamcinolone cream (KENALOG) 0.1 % Apply 1 Application topically 2 (two) times daily as needed. 10/25/23  Yes Particia Nearing, PA-C  albuterol [facility]) 108 [phone] Base) MCG/ACT inhaler Inhale 2 puffs into the lungs every 6 (six) hours as needed for wheezing or shortness of breath.     [provider]  amoxicillin (AMOXIL) 875 MG tablet Take 1 tablet (875 mg total) by mouth 2 (two) times daily. 10/17/21   Particia Nearing, PA-C  diazepam (VALIUM) 5 MG tablet Take 5 mg by mouth every 12 (twelve) hours as needed for anxiety.     [provider]  docusate sodium (COLACE) 100 MG capsule Take 1 capsule (100 mg total) by mouth 2 (two) times daily as needed for mild constipation. 04/25/20   Shivaji, Valerie Roys, MD  fluticasone (FLONASE) 50 MCG/ACT nasal spray Place 2 sprays into both nostrils daily.  01/04/17   [provider]  Fluticasone Furoate 100 MCG/ACT AEPB INHALE 1 PUFF INTO THE LUNGS EVERY DAY Patient taking differently: Inhale 1 puff into the lungs daily.  12/06/17   Kozlow, Alvira Philips, MD  furosemide (LASIX) 20 MG tablet Take 20 mg by mouth 2 (two) times daily.    [provider]  ibuprofen (ADVIL) 800 MG tablet Take 1 tablet (800 mg total) by mouth every 8 (eight) hours as needed. 04/25/20   Shivaji, Valerie Roys, MD  lisinopril (PRINIVIL,ZESTRIL) 10 MG tablet Take 10 mg by mouth daily.  01/08/17   [provider]  loratadine (CLARITIN) 10 MG tablet Take 10 mg by mouth daily.    [provider]  montelukast (SINGULAIR) 10 MG tablet Take 10 mg by mouth at bedtime.  01/08/17   [provider]  Multiple Vitamins-Minerals (MULTIVITAMIN ADULT PO) Take 1 tablet by mouth daily.     [provider]  omeprazole (PRILOSEC) 40 MG capsule TAKE 1 CAPSULE BY MOUTH EVERY DAY Patient taking differently: Take 40 mg by mouth daily.  11/17/17   Kozlow, Alvira Philips, MD  oxyCODONE (OXY IR/ROXICODONE) 5 MG immediate release tablet Take 1 tablet (5 mg total) by mouth every 6 (six) hours as needed for severe pain. 04/25/20   Shivaji, Valerie Roys, MD  sertraline (ZOLOFT) 100 MG tablet Take 100 mg by mouth daily.  01/08/17   [provider]    Family History Family History  Problem Relation Age of Onset   High blood pressure Mother    Colon cancer Maternal Grandmother    Heart disease Maternal Grandfather    Diabetes Maternal Grandfather    Asthma Daughter    Asthma Son     Social History Social History   Tobacco Use   Smoking status: Never   Smokeless tobacco: Never  Vaping Use    Vaping status: Never Used  Substance Use Topics   Alcohol use: No   Drug use: No     Allergies   Patient has no known allergies.   Review of Systems Review of Systems PER HPI  Physical Exam Triage Vital Signs ED Triage Vitals  Encounter Vitals Group     BP 10/25/23 0843 (!) 145/85     Systolic BP Percentile --      Diastolic BP Percentile --      Pulse Rate 10/25/23 0843 100     Resp 10/25/23 0843 18     Temp 10/25/23 0843 98.6 F (37 C)     Temp src --      SpO2 10/25/23 0843 99 %     Weight --      Height --      Head Circumference --      Peak Flow --      Pain Score 10/25/23 0846 8     Pain Loc --      Pain Education --      Exclude from Growth Chart --    No data found.  Updated Vital Signs BP (!) 145/85 (BP Location: Right Arm)   Pulse 100   Temp 98.6 F (37 C)   Resp 18   SpO2 99%   Visual Acuity Right Eye Distance:   Left Eye Distance:   Bilateral Distance:    Right Eye Near:   Left Eye Near:    Bilateral Near:     Physical Exam Vitals and nursing note reviewed.  Constitutional:      Appearance: Normal appearance. She is not ill-appearing.  HENT:     Head: Atraumatic.     Nose: Nose normal.     Mouth/Throat:     Mouth: Mucous membranes are moist.     Pharynx: Oropharynx is clear. No posterior oropharyngeal erythema.  Eyes:     Extraocular Movements: Extraocular movements intact.     Conjunctiva/sclera: Conjunctivae normal.  Cardiovascular:     Rate and Rhythm: Normal rate.  Pulmonary:     Effort: Pulmonary effort is normal.  Musculoskeletal:        General: Normal range of motion.     Cervical back: Normal range of motion and neck supple.  Skin:    General: Skin is warm and dry.     Findings: Rash present.     Comments: Erythematous papules and blisters to palms and soles bilaterally  Neurological:     Mental Status: She is alert and oriented to person, place, and time.  Psychiatric:        Mood and Affect: Mood normal.         Thought Content:  Thought content normal.        Judgment: Judgment normal.      UC Treatments / Results  Labs (all labs ordered are listed, but only abnormal results are displayed) Labs Reviewed - No data to display  EKG   Radiology No results found.  Procedures Procedures (including critical care time)  Medications Ordered in UC Medications - No data to display  Initial Impression / Assessment and Plan / UC Course  I have reviewed the triage vital signs and the nursing notes.  Pertinent labs & imaging results that were available during my care of the patient were reviewed by me and considered in my medical decision making (see chart for details).     Most consistent with hand-foot-and-mouth disease.  Treat with triamcinolone cream for itching, supportive over-the-counter measures.  Discussed contagious course and expectations for resolution. Final Clinical Impressions(s) / UC Diagnoses   Final diagnoses:  Rash   Discharge Instructions   None    ED Prescriptions     Medication Sig Dispense Auth. Provider   triamcinolone cream (KENALOG) 0.1 % Apply 1 Application topically 2 (two) times daily as needed. 60 g Particia Nearing, [place]      PDMP not reviewed this encounter.   Roosvelt Maser [place], [place] 10/25/23 [phone]

## 2023-10-25 NOTE — ED Triage Notes (Signed)
Pt reports a rash on her hands and feet, x 1 day states it is very painful.

## 2023-10-26 ENCOUNTER — Ambulatory Visit
Admission: RE | Admit: 2023-10-26 | Discharge: 2023-10-26 | Disposition: A | Discharge status: Home / Self Care | Payer: BC Managed Care – PPO | Source: Ambulatory Visit | Attending: Obstetrics and Gynecology

## 2023-10-26 ENCOUNTER — Ambulatory Visit: Payer: BC Managed Care – PPO

## 2023-10-26 DIAGNOSIS — R928 Other abnormal and inconclusive findings on diagnostic imaging of breast: Secondary | ICD-10-CM

## 2024-01-08 ENCOUNTER — Ambulatory Visit
Admission: EM | Admit: 2024-01-08 | Discharge: 2024-01-08 | Disposition: A | Discharge status: Home / Self Care | Payer: 59 | Attending: Family Medicine | Admitting: Family Medicine

## 2024-01-08 DIAGNOSIS — J4521 Mild intermittent asthma with (acute) exacerbation: Secondary | ICD-10-CM | POA: Diagnosis not present

## 2024-01-08 DIAGNOSIS — J069 Acute upper respiratory infection, unspecified: Secondary | ICD-10-CM | POA: Diagnosis not present

## 2024-01-08 LAB — POC COVID19/FLU A&B COMBO
Covid Antigen, POC: NEGATIVE
Influenza A Antigen, POC: NEGATIVE
Influenza B Antigen, POC: NEGATIVE

## 2024-01-08 LAB — POCT RAPID STREP A (OFFICE): Rapid Strep A Screen: NEGATIVE

## 2024-01-08 MED ORDER — DEXAMETHASONE SODIUM PHOSPHATE 10 MG/ML IJ SOLN
10.0000 mg | Freq: Once | INTRAMUSCULAR | Status: AC
  Administered 2024-01-08: 10 mg via INTRAMUSCULAR

## 2024-01-08 MED ORDER — PROMETHAZINE-DM 6.25-15 MG/5ML PO SYRP: 5.0000 mL | ORAL_SOLUTION | Freq: Four times a day (QID) | ORAL | 0 refills | Status: AC | PRN

## 2024-01-08 NOTE — ED Provider Notes (Signed)
 [facility]    CSN: [id number] Arrival date & time: 01/08/24  1136      History   Chief Complaint Chief Complaint  Patient presents with   Sore Throat    HPI Annette Cowan is a 48 y.o. female.   Presenting today with 2-day history of headache, cough, hoarseness, sore throat, chest tightness, wheezing, shortness of breath.  Denies fever, chills, body aches, abdominal pain, vomiting, diarrhea.  Recently resolving from flu post Tamiflu.  History of asthma on albuterol as needed.    Past Medical History:  Diagnosis Date   Anxiety    Asthma    Depression    High blood pressure    Migraine    Preeclampsia in postpartum period     Patient Active Problem List   Diagnosis Date Noted   Abnormal uterine bleeding (AUB) 04/25/2020    Past Surgical History:  Procedure Laterality Date   CYSTOSCOPY N/A 04/25/2020   Procedure: CYSTOSCOPY;  Surgeon: Carlisle Cater, MD;  Location: [facility];  Service: Gynecology;  Laterality: N/A;   KIDNEY STONE SURGERY     VAGINAL HYSTERECTOMY N/A 04/25/2020   Procedure: HYSTERECTOMY VAGINAL;  Surgeon: Carlisle Cater, MD;  Location: [facility];  Service: Gynecology;  Laterality: N/A;    OB History   No obstetric history on file.      Home Medications    Prior to Admission medications   Medication Sig Start Date End Date Taking? Authorizing Provider  promethazine-dextromethorphan (PROMETHAZINE-DM) 6.25-15 MG/5ML syrup Take 5 mLs by mouth 4 (four) times daily as needed. 01/08/24  Yes Particia Nearing, PA-C  albuterol [facility]) 108 [phone] Base) MCG/ACT inhaler Inhale 2 puffs into the lungs every 6 (six) hours as needed for wheezing or shortness of breath.     [provider]  amoxicillin (AMOXIL) 875 MG tablet Take 1 tablet (875 mg total) by mouth 2 (two) times daily. 10/17/21   Particia Nearing, PA-C  diazepam (VALIUM) 5 MG tablet Take 5 mg by mouth every 12 (twelve)  hours as needed for anxiety.    [provider]  docusate sodium (COLACE) 100 MG capsule Take 1 capsule (100 mg total) by mouth 2 (two) times daily as needed for mild constipation. 04/25/20   Shivaji, Valerie Roys, MD  fluticasone (FLONASE) 50 MCG/ACT nasal spray Place 2 sprays into both nostrils daily.  01/04/17   [provider]  Fluticasone Furoate 100 MCG/ACT AEPB INHALE 1 PUFF INTO THE LUNGS EVERY DAY Patient taking differently: Inhale 1 puff into the lungs daily.  12/06/17   Kozlow, Alvira Philips, MD  furosemide (LASIX) 20 MG tablet Take 20 mg by mouth 2 (two) times daily.    [provider]  ibuprofen (ADVIL) 800 MG tablet Take 1 tablet (800 mg total) by mouth every 8 (eight) hours as needed. 04/25/20   Shivaji, Valerie Roys, MD  lisinopril (PRINIVIL,ZESTRIL) 10 MG tablet Take 10 mg by mouth daily.  01/08/17   [provider]  loratadine (CLARITIN) 10 MG tablet Take 10 mg by mouth daily.    [provider]  montelukast (SINGULAIR) 10 MG tablet Take 10 mg by mouth at bedtime.  01/08/17   [provider]  Multiple Vitamins-Minerals (MULTIVITAMIN ADULT PO) Take 1 tablet by mouth daily.     [provider]  omeprazole (PRILOSEC) 40 MG capsule TAKE 1 CAPSULE BY MOUTH EVERY DAY Patient taking differently: Take 40 mg by mouth daily.  11/17/17  Kozlow, Alvira Philips, MD  oxyCODONE (OXY IR/ROXICODONE) 5 MG immediate release tablet Take 1 tablet (5 mg total) by mouth every 6 (six) hours as needed for severe pain. 04/25/20   Shivaji, Valerie Roys, MD  sertraline (ZOLOFT) 100 MG tablet Take 100 mg by mouth daily.  01/08/17   [provider]  triamcinolone cream (KENALOG) 0.1 % Apply 1 Application topically 2 (two) times daily as needed. 10/25/23   Particia Nearing, PA-C    Family History Family History  Problem Relation Age of Onset   High blood pressure Mother    Colon cancer Maternal Grandmother    Heart disease Maternal Grandfather    Diabetes Maternal  Grandfather    Asthma Daughter    Asthma Son     Social History Social History   Tobacco Use   Smoking status: Never   Smokeless tobacco: Never  Vaping Use   Vaping status: Never Used  Substance Use Topics   Alcohol use: No   Drug use: No     Allergies   Patient has no known allergies.   Review of Systems Review of Systems Per HPI  Physical Exam Triage Vital Signs ED Triage Vitals  Encounter Vitals Group     BP 01/08/24 1255 (!) 148/90     Systolic BP Percentile --      Diastolic BP Percentile --      Pulse Rate 01/08/24 1255 88     Resp 01/08/24 1255 18     Temp 01/08/24 1255 98 F (36.7 C)     Temp Source 01/08/24 1255 Oral     SpO2 01/08/24 1255 96 %     Weight --      Height --      Head Circumference --      Peak Flow --      Pain Score 01/08/24 1256 7     Pain Loc --      Pain Education --      Exclude from Growth Chart --    No data found.  Updated Vital Signs BP (!) 148/90 (BP Location: Right Arm)   Pulse 88   Temp 98 F (36.7 C) (Oral)   Resp 18   SpO2 96%   Visual Acuity Right Eye Distance:   Left Eye Distance:   Bilateral Distance:    Right Eye Near:   Left Eye Near:    Bilateral Near:     Physical Exam Vitals and nursing note reviewed.  Constitutional:      Appearance: Normal appearance.  HENT:     Head: Atraumatic.     Right Ear: Tympanic membrane and external ear normal.     Left Ear: Tympanic membrane and external ear normal.     Nose: Rhinorrhea present.     Mouth/Throat:     Mouth: Mucous membranes are moist.     Pharynx: Posterior oropharyngeal erythema present.  Eyes:     Extraocular Movements: Extraocular movements intact.     Conjunctiva/sclera: Conjunctivae normal.  Cardiovascular:     Rate and Rhythm: Normal rate and regular rhythm.     Heart sounds: Normal heart sounds.  Pulmonary:     Effort: Pulmonary effort is normal.     Breath sounds: Wheezing present. No rales.  Musculoskeletal:        General:  Normal range of motion.     Cervical back: Normal range of motion and neck supple.  Skin:    General: Skin is warm and dry.  Neurological:     Mental Status: She is alert and oriented to person, place, and time.  Psychiatric:        Mood and Affect: Mood normal.        Thought Content: Thought content normal.      UC Treatments / Results  Labs (all labs ordered are listed, but only abnormal results are displayed) Labs Reviewed  POCT RAPID STREP A (OFFICE)  POC COVID19/FLU A&B COMBO    EKG   Radiology No results found.  Procedures Procedures (including critical care time)  Medications Ordered in UC Medications  dexamethasone (DECADRON) injection 10 mg (10 mg Intramuscular Given 01/08/24 1352)    Initial Impression / Assessment and Plan / UC Course  I have reviewed the triage vital signs and the nursing notes.  Pertinent labs & imaging results that were available during my care of the patient were reviewed by me and considered in my medical decision making (see chart for details).     Vitals reassuring today, rapid flu, COVID and strep negative.  Suspect viral respiratory infection causing an asthma exacerbation.  Treat with Decadron IM, Phenergan DM, continued albuterol inhaler.  Return for worsening symptoms.  Final Clinical Impressions(s) / UC Diagnoses   Final diagnoses:  Viral URI with cough  Mild intermittent asthma with acute exacerbation   Discharge Instructions   None    ED Prescriptions     Medication Sig Dispense Auth. Provider   promethazine-dextromethorphan (PROMETHAZINE-DM) 6.25-15 MG/5ML syrup Take 5 mLs by mouth 4 (four) times daily as needed. 100 mL Particia Nearing, [place]      PDMP not reviewed this encounter.   Particia Nearing, [place] 01/08/24 1427

## 2024-01-08 NOTE — ED Triage Notes (Signed)
 Pt reports she has a headache, cough, throat pain/ burning, and has lost voice x 2 days

## 2024-04-29 ENCOUNTER — Ambulatory Visit: Admission: EM | Admit: 2024-04-29 | Discharge: 2024-04-29 | Disposition: A | Discharge status: Home / Self Care

## 2024-04-29 DIAGNOSIS — H6692 Otitis media, unspecified, left ear: Secondary | ICD-10-CM

## 2024-04-29 MED ORDER — AMOXICILLIN 875 MG PO TABS: 875.0000 mg | ORAL_TABLET | Freq: Two times a day (BID) | ORAL | 0 refills | Status: AC

## 2024-04-29 NOTE — ED Provider Notes (Signed)
 Annette Cowan    CSN: [id number] Arrival date & time: 04/29/24  0845      History   Chief Complaint Chief Complaint  Patient presents with   Otalgia   Facial Pain    HPI Annette Cowan is a 48 y.o. female.  Patient presents with left ear pain and pressure x 1 day.  She states the ear pain radiates to her left jaw and face.  No dental pain, fever, sore throat, difficulty swallowing, cough, shortness of breath.  No OTC medications taken today.  The history is provided by the patient and medical records.    Past Medical History:  Diagnosis Date   Anxiety    Asthma    Depression    High blood pressure    Migraine    Preeclampsia in postpartum period     Patient Active Problem List   Diagnosis Date Noted   Abnormal uterine bleeding (AUB) 04/25/2020    Past Surgical History:  Procedure Laterality Date   CYSTOSCOPY N/A 04/25/2020   Procedure: CYSTOSCOPY;  Surgeon: Gaspar Karma, MD;  Location: [facility];  Service: Gynecology;  Laterality: N/A;   KIDNEY STONE SURGERY     VAGINAL HYSTERECTOMY N/A 04/25/2020   Procedure: HYSTERECTOMY VAGINAL;  Surgeon: Gaspar Karma, MD;  Location: [facility];  Service: Gynecology;  Laterality: N/A;    OB History   No obstetric history on file.      Home Medications    Prior to Admission medications   Medication Sig Start Date End Date Taking? Authorizing Provider  amoxicillin  (AMOXIL ) 875 MG tablet Take 1 tablet (875 mg total) by mouth 2 (two) times daily for 10 days. 04/29/24 05/09/24 Yes Wellington Half, NP  albuterol (PROAIR HFA) 108 (90 Base) MCG/ACT inhaler Inhale 2 puffs into the lungs every 6 (six) hours as needed for wheezing or shortness of breath.     [provider]  diazepam (VALIUM) 5 MG tablet Take 5 mg by mouth every 12 (twelve) hours as needed for anxiety.    [provider]  docusate sodium  (COLACE) 100 MG capsule Take 1 capsule (100 mg total) by  mouth 2 (two) times daily as needed for mild constipation. Patient not taking: Reported on 04/29/2024 04/25/20   Shivaji, Martin Slay, MD  fluticasone  (FLONASE) 50 MCG/ACT nasal spray Place 2 sprays into both nostrils daily.  01/04/17   [provider]  Fluticasone  Furoate 100 MCG/ACT AEPB INHALE 1 PUFF INTO THE LUNGS EVERY DAY Patient taking differently: Inhale 1 puff into the lungs daily.  12/06/17   Kozlow, Rema Care, MD  furosemide (LASIX) 20 MG tablet Take 20 mg by mouth 2 (two) times daily.    [provider]  ibuprofen  (ADVIL ) 800 MG tablet Take 1 tablet (800 mg total) by mouth every 8 (eight) hours as needed. 04/25/20   Shivaji, Martin Slay, MD  lisinopril  (PRINIVIL ,ZESTRIL ) 10 MG tablet Take 10 mg by mouth daily.  01/08/17   [provider]  loratadine (CLARITIN) 10 MG tablet Take 10 mg by mouth daily.    [provider]  montelukast  (SINGULAIR ) 10 MG tablet Take 10 mg by mouth at bedtime.  01/08/17   [provider]  Multiple Vitamins-Minerals (MULTIVITAMIN ADULT PO) Take 1 tablet by mouth daily.     [provider]  omeprazole  (PRILOSEC) 40 MG capsule TAKE 1 CAPSULE BY MOUTH EVERY DAY Patient taking differently: Take 40 mg by mouth daily.  11/17/17  Kozlow, Rema Care, MD  oxyCODONE  (OXY IR/ROXICODONE ) 5 MG immediate release tablet Take 1 tablet (5 mg total) by mouth every 6 (six) hours as needed for severe pain. Patient not taking: Reported on 04/29/2024 04/25/20   Shivaji, Shanti M, MD  phentermine (ADIPEX-P) 37.5 MG tablet Take 37.5 mg by mouth every morning.    [provider]  promethazine -dextromethorphan (PROMETHAZINE -DM) 6.25-15 MG/5ML syrup Take 5 mLs by mouth 4 (four) times daily as needed. Patient not taking: Reported on 04/29/2024 01/08/24   Corbin Dess, PA-C  sertraline  (ZOLOFT ) 100 MG tablet Take 100 mg by mouth daily.  01/08/17   [provider]  triamcinolone  cream (KENALOG ) 0.1 % Apply 1 Application topically 2  (two) times daily as needed. Patient not taking: Reported on 04/29/2024 10/25/23   Corbin Dess, PA-C  WELLBUTRIN XL 300 MG 24 hr tablet 1 tablet in the morning Orally Once a day for 90 days    [provider]    Family History Family History  Problem Relation Age of Onset   High blood pressure Mother    Colon cancer Maternal Grandmother    Heart disease Maternal Grandfather    Diabetes Maternal Grandfather    Asthma Daughter    Asthma Son     Social History Social History   Tobacco Use   Smoking status: Never   Smokeless tobacco: Never  Vaping Use   Vaping status: Never Used  Substance Use Topics   Alcohol use: No   Drug use: No     Allergies   Patient has no known allergies.   Review of Systems Review of Systems  Constitutional:  Negative for chills and fever.  HENT:  Positive for ear pain. Negative for dental problem, ear discharge and sore throat.   Respiratory:  Negative for cough and shortness of breath.      Physical Exam Triage Vital Signs ED Triage Vitals [04/29/24 0905]  Encounter Vitals Group     BP 136/89     Girls Systolic BP Percentile      Girls Diastolic BP Percentile      Boys Systolic BP Percentile      Boys Diastolic BP Percentile      Pulse Rate 97     Resp 18     Temp 98 F (36.7 C)     Temp src      SpO2 97 %     Weight      Height      Head Circumference      Peak Flow      Pain Score 5     Pain Loc      Pain Education      Exclude from Growth Chart    No data found.  Updated Vital Signs BP 136/89   Pulse 97   Temp 98 F (36.7 C)   Resp 18   SpO2 97%   Visual Acuity Right Eye Distance:   Left Eye Distance:   Bilateral Distance:    Right Eye Near:   Left Eye Near:    Bilateral Near:     Physical Exam Constitutional:      General: She is not in acute distress. HENT:     Right Ear: Tympanic membrane normal.     Left Ear: Tympanic membrane is erythematous.     Nose: Nose normal.      Mouth/Throat:     Mouth: Mucous membranes are moist.     Pharynx: Oropharynx is clear.  Comments: Teeth in good repair.  Cardiovascular:     Rate and Rhythm: Normal rate and regular rhythm.     Heart sounds: Normal heart sounds.  Pulmonary:     Effort: Pulmonary effort is normal. No respiratory distress.     Breath sounds: Normal breath sounds.   Neurological:     Mental Status: She is alert.      UC Treatments / Results  Labs (all labs ordered are listed, but only abnormal results are displayed) Labs Reviewed - No data to display  EKG   Radiology No results found.  Procedures Procedures (including critical care time)  Medications Ordered in UC Medications - No data to display  Initial Impression / Assessment and Plan / UC Course  I have reviewed the triage vital signs and the nursing notes.  Pertinent labs & imaging results that were available during my care of the patient were reviewed by me and considered in my medical decision making (see chart for details).    Left otitis media.  Afebrile and vital signs are stable.  Treating with 10-day course of amoxicillin .  Tylenol  or ibuprofen  as needed.  Instructed patient to follow-up with her PCP.  Education provided on otitis media.  She agrees to plan of care.  Final Clinical Impressions(s) / UC Diagnoses   Final diagnoses:  Left otitis media, unspecified otitis media type     Discharge Instructions      Take the amoxicillin  as directed.  Follow up with your primary care provider on Monday.        ED Prescriptions     Medication Sig Dispense Auth. Provider   amoxicillin  (AMOXIL ) 875 MG tablet Take 1 tablet (875 mg total) by mouth 2 (two) times daily for 10 days. 20 tablet Wellington Half, NP      PDMP not reviewed this encounter.   Wellington Half, NP 04/29/24 [phone]

## 2024-04-29 NOTE — Discharge Instructions (Addendum)
Take the amoxicillin as directed.  Follow up with your primary care provider on Monday.

## 2024-04-29 NOTE — ED Triage Notes (Signed)
 Patient to Urgent Care with complaints of facial and head pressure/ left sided ear to jaw pressure. Denies any known fevers.  Symptoms started yesterday.   No otc meds.

## 2024-04-30 ENCOUNTER — Ambulatory Visit: Payer: Self-pay

## 2024-07-08 ENCOUNTER — Telehealth: Admitting: Nurse Practitioner

## 2024-07-08 DIAGNOSIS — B9689 Other specified bacterial agents as the cause of diseases classified elsewhere: Secondary | ICD-10-CM | POA: Diagnosis not present

## 2024-07-08 DIAGNOSIS — J329 Chronic sinusitis, unspecified: Secondary | ICD-10-CM | POA: Diagnosis not present

## 2024-07-08 MED ORDER — AMOXICILLIN-POT CLAVULANATE 875-125 MG PO TABS: 1.0000 | ORAL_TABLET | Freq: Two times a day (BID) | ORAL | 0 refills | Status: AC

## 2024-07-08 MED ORDER — IPRATROPIUM BROMIDE 0.03 % NA SOLN: 2.0000 | Freq: Two times a day (BID) | NASAL | 0 refills | Status: AC

## 2024-07-08 NOTE — Patient Instructions (Signed)
 Annette Cowan, thank you for joining Haze LELON Servant, NP for today's virtual visit.  While this provider is not your primary care provider (PCP), if your PCP is located in our provider database this encounter information will be shared with them immediately following your visit.   A [facility] MyChart account gives you access to today's visit and all your visits, tests, and labs performed at [facility]  click here if you don't have a [facility] MyChart account or go to [email or web address]  Consent: (Patient) Annette Cowan provided verbal consent for this virtual visit at the beginning of the encounter.  Current Medications:  Current Outpatient Medications:    amoxicillin -clavulanate (AUGMENTIN ) 875-125 MG tablet, Take 1 tablet by mouth 2 (two) times daily for 7 days., Disp: 14 tablet, Rfl: 0   albuterol (PROAIR HFA) 108 (90 Base) MCG/ACT inhaler, Inhale 2 puffs into the lungs every 6 (six) hours as needed for wheezing or shortness of breath. , Disp: , Rfl:    diazepam (VALIUM) 5 MG tablet, Take 5 mg by mouth every 12 (twelve) hours as needed for anxiety., Disp: , Rfl:    docusate sodium  (COLACE) 100 MG capsule, Take 1 capsule (100 mg total) by mouth 2 (two) times daily as needed for mild constipation. (Patient not taking: Reported on 04/29/2024), Disp: 30 capsule, Rfl: 0   fluticasone  (FLONASE) 50 MCG/ACT nasal spray, Place 2 sprays into both nostrils daily. , Disp: , Rfl:    Fluticasone  Furoate 100 MCG/ACT AEPB, INHALE 1 PUFF INTO THE LUNGS EVERY DAY (Patient taking differently: Inhale 1 puff into the lungs daily. ), Disp: 30 each, Rfl: 0   furosemide (LASIX) 20 MG tablet, Take 20 mg by mouth 2 (two) times daily., Disp: , Rfl:    ibuprofen  (ADVIL ) 800 MG tablet, Take 1 tablet (800 mg total) by mouth every 8 (eight) hours as needed., Disp: 60 tablet, Rfl: 1   lisinopril  (PRINIVIL ,ZESTRIL ) 10 MG tablet, Take 10 mg by mouth daily. , Disp: , Rfl:    loratadine  (CLARITIN) 10 MG tablet, Take 10 mg by mouth daily., Disp: , Rfl:    montelukast  (SINGULAIR ) 10 MG tablet, Take 10 mg by mouth at bedtime. , Disp: , Rfl:    Multiple Vitamins-Minerals (MULTIVITAMIN ADULT PO), Take 1 tablet by mouth daily. , Disp: , Rfl:    omeprazole  (PRILOSEC) 40 MG capsule, TAKE 1 CAPSULE BY MOUTH EVERY DAY (Patient taking differently: Take 40 mg by mouth daily. ), Disp: 30 capsule, Rfl: 0   oxyCODONE  (OXY IR/ROXICODONE ) 5 MG immediate release tablet, Take 1 tablet (5 mg total) by mouth every 6 (six) hours as needed for severe pain. (Patient not taking: Reported on 04/29/2024), Disp: 30 tablet, Rfl: 0   phentermine (ADIPEX-P) 37.5 MG tablet, Take 37.5 mg by mouth every morning., Disp: , Rfl:    promethazine -dextromethorphan (PROMETHAZINE -DM) 6.25-15 MG/5ML syrup, Take 5 mLs by mouth 4 (four) times daily as needed. (Patient not taking: Reported on 04/29/2024), Disp: 100 mL, Rfl: 0   sertraline  (ZOLOFT ) 100 MG tablet, Take 100 mg by mouth daily. , Disp: , Rfl:    triamcinolone  cream (KENALOG ) 0.1 %, Apply 1 Application topically 2 (two) times daily as needed. (Patient not taking: Reported on 04/29/2024), Disp: 60 g, Rfl: 0   WELLBUTRIN XL 300 MG 24 hr tablet, 1 tablet in the morning Orally Once a day for 90 days, Disp: , Rfl:    Medications ordered in this encounter:  Meds ordered this encounter  Medications  amoxicillin -clavulanate (AUGMENTIN ) 875-125 MG tablet    Sig: Take 1 tablet by mouth 2 (two) times daily for 7 days.    Dispense:  14 tablet    Refill:  0    Supervising Provider:   BLAISE ALEENE KIDD [[id number]]     *If you need refills on other medications prior to your next appointment, please contact your pharmacy*  Follow-Up: Call back or seek an in-person evaluation if the symptoms worsen or if the condition fails to improve as anticipated.  [facility] [phone]  Other Instructions INSTRUCTIONS: use a humidifier for nasal congestion Drink plenty  of fluids, rest and wash hands frequently to avoid the spread of infection Alternate tylenol  and Motrin  for relief of fever    If you have been instructed to have an in-person evaluation today at a local Urgent Care facility, please use the link below. It will take you to a list of all of our available [facility], including address, phone number and hours of operation. Please do not delay care.  [facility]  If you or a family member do not have a primary care provider, use the link below to schedule a visit and establish care. When you choose a [facility] primary care physician or advanced practice provider, you gain a long-term partner in health. Find a Primary Care Provider  Learn more about [facility]'s in-office and virtual care options: [facility] - Get Care Now

## 2024-07-08 NOTE — Progress Notes (Signed)
 Virtual Visit Consent   ISYSS ESPINAL, you are scheduled for a virtual visit with a [facility] provider today. Just as with appointments in the office, your consent must be obtained to participate. Your consent will be active for this visit and any virtual visit you may have with one of our providers in the next 365 days. If you have a MyChart account, a copy of this consent can be sent to you electronically.  As this is a virtual visit, video technology does not allow for your provider to perform a traditional examination. This may limit your provider's ability to fully assess your condition. If your provider identifies any concerns that need to be evaluated in person or the need to arrange testing (such as labs, EKG, etc.), we will make arrangements to do so. Although advances in technology are sophisticated, we cannot ensure that it will always work on either your end or our end. If the connection with a video visit is poor, the visit may have to be switched to a telephone visit. With either a video or telephone visit, we are not always able to ensure that we have a secure connection.  By engaging in this virtual visit, you consent to the provision of healthcare and authorize for your insurance to be billed (if applicable) for the services provided during this visit. Depending on your insurance coverage, you may receive a charge related to this service.  I need to obtain your verbal consent now. Are you willing to proceed with your visit today? Meghana Tullo Stancil has provided verbal consent on 07/08/2024 for a virtual visit (video or telephone). Haze LELON Servant, NP  Date: 07/08/2024 1:15 PM   Virtual Visit via Video Note   I, Haze LELON Servant, connected with  Annette Cowan  ([id number], 02/12/1976) on 07/08/24 at  1:00 PM EDT by a video-enabled telemedicine application and verified that I am speaking with the correct person using two identifiers.  Location: Patient: Virtual Visit  Location Patient: Home Provider: Virtual Visit Location Provider: Home Office   I discussed the limitations of evaluation and management by telemedicine and the availability of in person appointments. The patient expressed understanding and agreed to proceed.    History of Present Illness: Annette Cowan is a 48 y.o. who identifies as a female who was assigned female at birth, and is being seen today for sinusitis.  Ms. Lavalle has been experiencing postnasal drip, sinus pressure, sinus headache, sore throat, right ear pain, white patches in the back of throat with minimal cough.  She has not taken a COVID test at this time   Problems:  Patient Active Problem List   Diagnosis Date Noted   Abnormal uterine bleeding (AUB) 04/25/2020    Allergies: No Known Allergies Medications:  Current Outpatient Medications:    amoxicillin -clavulanate (AUGMENTIN ) 875-125 MG tablet, Take 1 tablet by mouth 2 (two) times daily for 7 days., Disp: 14 tablet, Rfl: 0   albuterol (PROAIR HFA) 108 (90 Base) MCG/ACT inhaler, Inhale 2 puffs into the lungs every 6 (six) hours as needed for wheezing or shortness of breath. , Disp: , Rfl:    diazepam (VALIUM) 5 MG tablet, Take 5 mg by mouth every 12 (twelve) hours as needed for anxiety., Disp: , Rfl:    docusate sodium  (COLACE) 100 MG capsule, Take 1 capsule (100 mg total) by mouth 2 (two) times daily as needed for mild constipation. (Patient not taking: Reported on 04/29/2024), Disp: 30 capsule, Rfl: 0  fluticasone  (FLONASE) 50 MCG/ACT nasal spray, Place 2 sprays into both nostrils daily. , Disp: , Rfl:    Fluticasone  Furoate 100 MCG/ACT AEPB, INHALE 1 PUFF INTO THE LUNGS EVERY DAY (Patient taking differently: Inhale 1 puff into the lungs daily. ), Disp: 30 each, Rfl: 0   furosemide (LASIX) 20 MG tablet, Take 20 mg by mouth 2 (two) times daily., Disp: , Rfl:    ibuprofen  (ADVIL ) 800 MG tablet, Take 1 tablet (800 mg total) by mouth every 8 (eight) hours as  needed., Disp: 60 tablet, Rfl: 1   lisinopril  (PRINIVIL ,ZESTRIL ) 10 MG tablet, Take 10 mg by mouth daily. , Disp: , Rfl:    loratadine (CLARITIN) 10 MG tablet, Take 10 mg by mouth daily., Disp: , Rfl:    montelukast  (SINGULAIR ) 10 MG tablet, Take 10 mg by mouth at bedtime. , Disp: , Rfl:    Multiple Vitamins-Minerals (MULTIVITAMIN ADULT PO), Take 1 tablet by mouth daily. , Disp: , Rfl:    omeprazole  (PRILOSEC) 40 MG capsule, TAKE 1 CAPSULE BY MOUTH EVERY DAY (Patient taking differently: Take 40 mg by mouth daily. ), Disp: 30 capsule, Rfl: 0   oxyCODONE  (OXY IR/ROXICODONE ) 5 MG immediate release tablet, Take 1 tablet (5 mg total) by mouth every 6 (six) hours as needed for severe pain. (Patient not taking: Reported on 04/29/2024), Disp: 30 tablet, Rfl: 0   phentermine (ADIPEX-P) 37.5 MG tablet, Take 37.5 mg by mouth every morning., Disp: , Rfl:    promethazine -dextromethorphan (PROMETHAZINE -DM) 6.25-15 MG/5ML syrup, Take 5 mLs by mouth 4 (four) times daily as needed. (Patient not taking: Reported on 04/29/2024), Disp: 100 mL, Rfl: 0   sertraline  (ZOLOFT ) 100 MG tablet, Take 100 mg by mouth daily. , Disp: , Rfl:    triamcinolone  cream (KENALOG ) 0.1 %, Apply 1 Application topically 2 (two) times daily as needed. (Patient not taking: Reported on 04/29/2024), Disp: 60 g, Rfl: 0   WELLBUTRIN XL 300 MG 24 hr tablet, 1 tablet in the morning Orally Once a day for 90 days, Disp: , Rfl:   Observations/Objective: Patient is well-developed, well-nourished in no acute distress.  Resting comfortably at home.  Head is normocephalic, atraumatic.  No labored breathing.  Speech is clear and coherent with logical content.  Patient is alert and oriented at baseline.    Assessment and Plan: 1. Bacterial sinusitis (Primary) - amoxicillin -clavulanate (AUGMENTIN ) 875-125 MG tablet; Take 1 tablet by mouth 2 (two) times daily for 7 days.  Dispense: 14 tablet; Refill: 0  INSTRUCTIONS: use a humidifier for nasal  congestion Drink plenty of fluids, rest and wash hands frequently to avoid the spread of infection Alternate tylenol  and Motrin  for relief of fever   Follow Up Instructions: I discussed the assessment and treatment plan with the patient. The patient was provided an opportunity to ask questions and all were answered. The patient agreed with the plan and demonstrated an understanding of the instructions.  A copy of instructions were sent to the patient via MyChart unless otherwise noted below.    The patient was advised to call back or seek an in-person evaluation if the symptoms worsen or if the condition fails to improve as anticipated.    Tayla Panozzo W Matalyn Nawaz, NP

## 2024-07-31 ENCOUNTER — Other Ambulatory Visit: Payer: Self-pay | Admitting: Nurse Practitioner

## 2024-07-31 DIAGNOSIS — B9689 Other specified bacterial agents as the cause of diseases classified elsewhere: Secondary | ICD-10-CM
